# Patient Record
Sex: Male | Born: 1960
Health system: Southern US, Community
[De-identification: ages and names within clinical notes are randomized; demographics above are authoritative.]

## PROBLEM LIST (undated history)

## (undated) DIAGNOSIS — J189 Pneumonia, unspecified organism: Secondary | ICD-10-CM

## (undated) DIAGNOSIS — I10 Essential (primary) hypertension: Secondary | ICD-10-CM

## (undated) DIAGNOSIS — F319 Bipolar disorder, unspecified: Secondary | ICD-10-CM

## (undated) DIAGNOSIS — R569 Unspecified convulsions: Secondary | ICD-10-CM

## (undated) DIAGNOSIS — K259 Gastric ulcer, unspecified as acute or chronic, without hemorrhage or perforation: Secondary | ICD-10-CM

## (undated) DIAGNOSIS — F1010 Alcohol abuse, uncomplicated: Secondary | ICD-10-CM

## (undated) DIAGNOSIS — I639 Cerebral infarction, unspecified: Secondary | ICD-10-CM

## (undated) DIAGNOSIS — I739 Peripheral vascular disease, unspecified: Secondary | ICD-10-CM

## (undated) DIAGNOSIS — K759 Inflammatory liver disease, unspecified: Secondary | ICD-10-CM

## (undated) HISTORY — PX: HERNIA REPAIR: SHX51

## (undated) HISTORY — DX: Bipolar disorder, unspecified: F31.9

## (undated) HISTORY — DX: Essential (primary) hypertension: I10

---

## 1988-10-05 HISTORY — PX: BRAIN TUMOR EXCISION: SHX577

## 1998-05-28 ENCOUNTER — Emergency Department (HOSPITAL_COMMUNITY): Admission: EM | Admit: 1998-05-28 | Discharge: 1998-05-28 | Payer: Self-pay | Admitting: Emergency Medicine

## 1998-12-13 ENCOUNTER — Emergency Department (HOSPITAL_COMMUNITY): Admission: EM | Admit: 1998-12-13 | Discharge: 1998-12-14 | Payer: Self-pay | Admitting: Emergency Medicine

## 1998-12-30 ENCOUNTER — Encounter: Payer: Self-pay | Admitting: Emergency Medicine

## 1998-12-30 ENCOUNTER — Inpatient Hospital Stay (HOSPITAL_COMMUNITY): Admission: EM | Admit: 1998-12-30 | Discharge: 1999-01-06 | Payer: Self-pay | Admitting: Emergency Medicine

## 1999-09-08 ENCOUNTER — Emergency Department (HOSPITAL_COMMUNITY): Admission: EM | Admit: 1999-09-08 | Discharge: 1999-09-08 | Payer: Self-pay | Admitting: Emergency Medicine

## 2000-05-07 ENCOUNTER — Emergency Department (HOSPITAL_COMMUNITY): Admission: EM | Admit: 2000-05-07 | Discharge: 2000-05-07 | Payer: Self-pay | Admitting: Emergency Medicine

## 2000-05-24 ENCOUNTER — Inpatient Hospital Stay (HOSPITAL_COMMUNITY): Admission: EM | Admit: 2000-05-24 | Discharge: 2000-06-03 | Payer: Self-pay

## 2000-05-24 ENCOUNTER — Encounter: Payer: Self-pay | Admitting: Internal Medicine

## 2000-05-25 ENCOUNTER — Encounter: Payer: Self-pay | Admitting: Internal Medicine

## 2000-05-25 ENCOUNTER — Encounter: Payer: Self-pay | Admitting: Pulmonary Disease

## 2000-05-26 ENCOUNTER — Encounter: Payer: Self-pay | Admitting: Pulmonary Disease

## 2000-05-27 ENCOUNTER — Encounter: Payer: Self-pay | Admitting: Pulmonary Disease

## 2000-05-28 ENCOUNTER — Encounter: Payer: Self-pay | Admitting: Pulmonary Disease

## 2000-05-29 ENCOUNTER — Encounter: Payer: Self-pay | Admitting: Pulmonary Disease

## 2000-05-30 ENCOUNTER — Encounter: Payer: Self-pay | Admitting: Pulmonary Disease

## 2000-05-31 ENCOUNTER — Encounter: Payer: Self-pay | Admitting: Pulmonary Disease

## 2000-07-31 ENCOUNTER — Emergency Department (HOSPITAL_COMMUNITY): Admission: EM | Admit: 2000-07-31 | Discharge: 2000-07-31 | Payer: Self-pay | Admitting: Emergency Medicine

## 2000-07-31 ENCOUNTER — Encounter: Payer: Self-pay | Admitting: Emergency Medicine

## 2001-01-07 ENCOUNTER — Encounter: Payer: Self-pay | Admitting: Emergency Medicine

## 2001-01-07 ENCOUNTER — Inpatient Hospital Stay (HOSPITAL_COMMUNITY): Admission: EM | Admit: 2001-01-07 | Discharge: 2001-01-12 | Payer: Self-pay | Admitting: Emergency Medicine

## 2001-01-10 ENCOUNTER — Encounter: Payer: Self-pay | Admitting: Surgery

## 2001-01-19 ENCOUNTER — Emergency Department (HOSPITAL_COMMUNITY): Admission: EM | Admit: 2001-01-19 | Discharge: 2001-01-20 | Payer: Self-pay | Admitting: Emergency Medicine

## 2001-01-20 ENCOUNTER — Encounter: Payer: Self-pay | Admitting: Emergency Medicine

## 2001-04-06 ENCOUNTER — Emergency Department (HOSPITAL_COMMUNITY): Admission: EM | Admit: 2001-04-06 | Discharge: 2001-04-06 | Payer: Self-pay | Admitting: Emergency Medicine

## 2001-04-06 ENCOUNTER — Encounter: Payer: Self-pay | Admitting: Emergency Medicine

## 2002-01-05 ENCOUNTER — Emergency Department (HOSPITAL_COMMUNITY): Admission: EM | Admit: 2002-01-05 | Discharge: 2002-01-05 | Payer: Self-pay

## 2002-02-16 ENCOUNTER — Encounter: Payer: Self-pay | Admitting: Emergency Medicine

## 2002-02-16 ENCOUNTER — Emergency Department (HOSPITAL_COMMUNITY): Admission: EM | Admit: 2002-02-16 | Discharge: 2002-02-16 | Payer: Self-pay | Admitting: Emergency Medicine

## 2002-04-04 ENCOUNTER — Emergency Department (HOSPITAL_COMMUNITY): Admission: EM | Admit: 2002-04-04 | Discharge: 2002-04-04 | Payer: Self-pay | Admitting: Emergency Medicine

## 2002-04-06 ENCOUNTER — Emergency Department (HOSPITAL_COMMUNITY): Admission: EM | Admit: 2002-04-06 | Discharge: 2002-04-06 | Payer: Self-pay

## 2003-08-26 ENCOUNTER — Emergency Department (HOSPITAL_COMMUNITY): Admission: EM | Admit: 2003-08-26 | Discharge: 2003-08-26 | Payer: Self-pay | Admitting: Emergency Medicine

## 2003-09-01 ENCOUNTER — Emergency Department (HOSPITAL_COMMUNITY): Admission: EM | Admit: 2003-09-01 | Discharge: 2003-09-01 | Payer: Self-pay | Admitting: Emergency Medicine

## 2004-10-24 ENCOUNTER — Emergency Department (HOSPITAL_COMMUNITY): Admission: EM | Admit: 2004-10-24 | Discharge: 2004-10-24 | Payer: Self-pay | Admitting: Emergency Medicine

## 2005-07-13 ENCOUNTER — Emergency Department (HOSPITAL_COMMUNITY): Admission: EM | Admit: 2005-07-13 | Discharge: 2005-07-14 | Payer: Self-pay | Admitting: Emergency Medicine

## 2005-10-04 ENCOUNTER — Emergency Department (HOSPITAL_COMMUNITY): Admission: EM | Admit: 2005-10-04 | Discharge: 2005-10-04 | Payer: Self-pay | Admitting: Emergency Medicine

## 2005-11-07 ENCOUNTER — Emergency Department (HOSPITAL_COMMUNITY): Admission: EM | Admit: 2005-11-07 | Discharge: 2005-11-08 | Payer: Self-pay | Admitting: Emergency Medicine

## 2008-03-31 ENCOUNTER — Emergency Department (HOSPITAL_COMMUNITY): Admission: EM | Admit: 2008-03-31 | Discharge: 2008-03-31 | Payer: Self-pay | Admitting: Emergency Medicine

## 2008-06-11 ENCOUNTER — Emergency Department (HOSPITAL_COMMUNITY): Admission: EM | Admit: 2008-06-11 | Discharge: 2008-06-11 | Payer: Self-pay | Admitting: Emergency Medicine

## 2008-12-22 ENCOUNTER — Emergency Department (HOSPITAL_COMMUNITY): Admission: EM | Admit: 2008-12-22 | Discharge: 2008-12-22 | Payer: Self-pay | Admitting: Emergency Medicine

## 2009-01-21 ENCOUNTER — Emergency Department (HOSPITAL_COMMUNITY): Admission: EM | Admit: 2009-01-21 | Discharge: 2009-01-22 | Payer: Self-pay | Admitting: Emergency Medicine

## 2009-02-13 ENCOUNTER — Emergency Department (HOSPITAL_COMMUNITY): Admission: EM | Admit: 2009-02-13 | Discharge: 2009-02-14 | Payer: Self-pay | Admitting: Emergency Medicine

## 2009-04-03 ENCOUNTER — Emergency Department (HOSPITAL_COMMUNITY): Admission: EM | Admit: 2009-04-03 | Discharge: 2009-04-03 | Payer: Self-pay | Admitting: Emergency Medicine

## 2010-04-27 ENCOUNTER — Encounter: Payer: Self-pay | Admitting: Emergency Medicine

## 2010-04-27 ENCOUNTER — Inpatient Hospital Stay (HOSPITAL_COMMUNITY): Admission: EM | Admit: 2010-04-27 | Discharge: 2010-04-28 | Payer: Self-pay

## 2010-06-14 ENCOUNTER — Emergency Department (HOSPITAL_COMMUNITY): Admission: EM | Admit: 2010-06-14 | Discharge: 2010-06-15 | Payer: Self-pay | Admitting: Emergency Medicine

## 2010-06-21 ENCOUNTER — Emergency Department (HOSPITAL_COMMUNITY): Admission: EM | Admit: 2010-06-21 | Discharge: 2010-06-21 | Payer: Self-pay | Admitting: Emergency Medicine

## 2010-07-11 ENCOUNTER — Emergency Department (HOSPITAL_COMMUNITY): Admission: EM | Admit: 2010-07-11 | Discharge: 2010-07-12 | Payer: Self-pay | Admitting: Emergency Medicine

## 2010-09-19 ENCOUNTER — Emergency Department (HOSPITAL_COMMUNITY)
Admission: EM | Admit: 2010-09-19 | Discharge: 2010-09-19 | Disposition: A | Discharge status: Other Acute Inpatient Hospital | Payer: Self-pay | Source: Home / Self Care | Admitting: Emergency Medicine

## 2010-12-15 LAB — DIFFERENTIAL
Lymphocytes Relative: 34 % (ref 12–46)
Lymphs Abs: 3.1 10*3/uL (ref 0.7–4.0)
Monocytes Absolute: 0.6 10*3/uL (ref 0.1–1.0)
Monocytes Relative: 6 % (ref 3–12)
Neutro Abs: 5 10*3/uL (ref 1.7–7.7)

## 2010-12-15 LAB — CBC
HCT: 40.9 % (ref 39.0–52.0)
Hemoglobin: 14 g/dL (ref 13.0–17.0)
MCHC: 34.2 g/dL (ref 30.0–36.0)
WBC: 9 10*3/uL (ref 4.0–10.5)

## 2010-12-15 LAB — BASIC METABOLIC PANEL
GFR calc non Af Amer: >60 mL/min (ref >60)
Glucose, Bld: 140 mg/dL — ABNORMAL HIGH (ref 70–99)
Potassium: 3.7 mEq/L (ref 3.5–5.1)
Sodium: 143 mEq/L (ref 135–145)

## 2010-12-17 LAB — DIFFERENTIAL
Basophils Absolute: 0 10*3/uL (ref 0.0–0.1)
Basophils Relative: 1 % (ref 0–1)
Eosinophils Absolute: 0.3 10*3/uL (ref 0.0–0.7)
Monocytes Absolute: 0.6 10*3/uL (ref 0.1–1.0)
Neutro Abs: 4.5 10*3/uL (ref 1.7–7.7)
Neutrophils Relative %: 60 % (ref 43–77)

## 2010-12-17 LAB — URINALYSIS, ROUTINE W REFLEX MICROSCOPIC
Bilirubin Urine: NEGATIVE
Glucose, UA: NEGATIVE mg/dL
Hgb urine dipstick: NEGATIVE
Specific Gravity, Urine: 1.007 (ref 1.005–1.030)
pH: 6 (ref 5.0–8.0)

## 2010-12-17 LAB — CBC
HCT: 41.4 % (ref 39.0–52.0)
MCH: 31.2 pg (ref 26.0–34.0)
MCHC: 34.5 g/dL (ref 30.0–36.0)
RDW: 14.3 % (ref 11.5–15.5)

## 2010-12-17 LAB — BASIC METABOLIC PANEL
BUN: 6 mg/dL (ref 6–23)
Calcium: 8.8 mg/dL (ref 8.4–10.5)
GFR calc non Af Amer: >60 mL/min (ref >60)
Glucose, Bld: 100 mg/dL — ABNORMAL HIGH (ref 70–99)

## 2010-12-17 LAB — RAPID URINE DRUG SCREEN, HOSP PERFORMED
Amphetamines: NOT DETECTED
Cocaine: NOT DETECTED
Opiates: NOT DETECTED
Tetrahydrocannabinol: NOT DETECTED

## 2010-12-18 LAB — CBC
HCT: 42.3 % (ref 39.0–52.0)
Hemoglobin: 14.2 g/dL (ref 13.0–17.0)
MCH: 30.5 pg (ref 26.0–34.0)
MCHC: 33.6 g/dL (ref 30.0–36.0)
MCV: 90.8 fL (ref 78.0–100.0)
Platelets: 158 K/uL (ref 150–400)
RBC: 4.66 MIL/uL (ref 4.22–5.81)
RDW: 13.8 % (ref 11.5–15.5)
WBC: 7.5 K/uL (ref 4.0–10.5)

## 2010-12-18 LAB — URINALYSIS, ROUTINE W REFLEX MICROSCOPIC
Bilirubin Urine: NEGATIVE
Glucose, UA: NEGATIVE mg/dL
Hgb urine dipstick: NEGATIVE
Ketones, ur: NEGATIVE mg/dL
Nitrite: NEGATIVE
Protein, ur: NEGATIVE mg/dL
Specific Gravity, Urine: 1.018 (ref 1.005–1.030)
Urobilinogen, UA: 0.2 mg/dL (ref 0.0–1.0)
pH: 5 (ref 5.0–8.0)

## 2010-12-18 LAB — RAPID URINE DRUG SCREEN, HOSP PERFORMED
Amphetamines: NOT DETECTED
Barbiturates: NOT DETECTED
Benzodiazepines: NOT DETECTED
Cocaine: NOT DETECTED
Opiates: NOT DETECTED
Tetrahydrocannabinol: POSITIVE — AB

## 2010-12-18 LAB — ETHANOL: Alcohol, Ethyl (B): 248 mg/dL — ABNORMAL HIGH (ref 0–10)

## 2010-12-18 LAB — COMPREHENSIVE METABOLIC PANEL
ALT: 20 U/L (ref 0–53)
AST: 25 U/L (ref 0–37)
Albumin: 3.9 g/dL (ref 3.5–5.2)
CO2: 21 mEq/L (ref 19–32)
Calcium: 8.5 mg/dL (ref 8.4–10.5)
Creatinine, Ser: 0.81 mg/dL (ref 0.4–1.5)
GFR calc Af Amer: >60 mL/min (ref >60)
GFR calc non Af Amer: >60 mL/min (ref >60)
Sodium: 139 mEq/L (ref 135–145)
Total Protein: 7.3 g/dL (ref 6.0–8.3)

## 2010-12-18 LAB — APTT: aPTT: 29 seconds (ref 24–37)

## 2010-12-18 LAB — DIFFERENTIAL
Basophils Absolute: 0 K/uL (ref 0.0–0.1)
Basophils Relative: 1 % (ref 0–1)
Eosinophils Absolute: 0.1 K/uL (ref 0.0–0.7)
Eosinophils Relative: 2 % (ref 0–5)
Lymphocytes Relative: 25 % (ref 12–46)
Lymphs Abs: 1.9 K/uL (ref 0.7–4.0)
Monocytes Absolute: 0.5 K/uL (ref 0.1–1.0)
Monocytes Relative: 7 % (ref 3–12)
Neutro Abs: 5 K/uL (ref 1.7–7.7)
Neutrophils Relative %: 67 % (ref 43–77)

## 2010-12-18 LAB — LIPASE, BLOOD: Lipase: 24 U/L (ref 11–59)

## 2010-12-20 LAB — PROTIME-INR
INR: 0.98 (ref 0.00–1.49)
Prothrombin Time: 12.9 seconds (ref 11.6–15.2)

## 2010-12-20 LAB — CBC
HCT: 40.6 % (ref 39.0–52.0)
MCH: 31.2 pg (ref 26.0–34.0)
MCV: 91.9 fL (ref 78.0–100.0)
RBC: 4.42 MIL/uL (ref 4.22–5.81)
WBC: 6.6 10*3/uL (ref 4.0–10.5)

## 2010-12-20 LAB — DIFFERENTIAL
Basophils Absolute: 0.1 10*3/uL (ref 0.0–0.1)
Basophils Relative: 1 % (ref 0–1)
Neutro Abs: 3.7 10*3/uL (ref 1.7–7.7)
Neutrophils Relative %: 55 % (ref 43–77)

## 2010-12-20 LAB — COMPREHENSIVE METABOLIC PANEL
Alkaline Phosphatase: 81 U/L (ref 39–117)
BUN: 9 mg/dL (ref 6–23)
Chloride: 109 mEq/L (ref 96–112)
Creatinine, Ser: 0.85 mg/dL (ref 0.4–1.5)
GFR calc non Af Amer: >60 mL/min (ref >60)
Glucose, Bld: 92 mg/dL (ref 70–99)
Potassium: 3.5 mEq/L (ref 3.5–5.1)
Total Bilirubin: 0.2 mg/dL — ABNORMAL LOW (ref 0.3–1.2)

## 2010-12-20 LAB — RAPID URINE DRUG SCREEN, HOSP PERFORMED
Barbiturates: NOT DETECTED
Benzodiazepines: NOT DETECTED
Opiates: NOT DETECTED

## 2010-12-20 LAB — PHENYTOIN LEVEL, TOTAL: Phenytoin Lvl: 0.7 ug/mL — ABNORMAL LOW (ref 10.0–20.0)

## 2011-01-12 LAB — POCT I-STAT, CHEM 8
BUN: 7 mg/dL (ref 6–23)
Calcium, Ion: 1.05 mmol/L — ABNORMAL LOW (ref 1.12–1.32)
Chloride: 111 mEq/L (ref 96–112)
Creatinine, Ser: 1 mg/dL (ref 0.4–1.5)
Glucose, Bld: 98 mg/dL (ref 70–99)
HCT: 48 % (ref 39.0–52.0)
Hemoglobin: 16.3 g/dL (ref 13.0–17.0)
Potassium: 4.1 mEq/L (ref 3.5–5.1)
Sodium: 142 meq/L (ref 135–145)
TCO2: 19 mmol/L (ref 0–100)

## 2011-01-12 LAB — RAPID URINE DRUG SCREEN, HOSP PERFORMED
Amphetamines: NOT DETECTED
Barbiturates: NOT DETECTED
Benzodiazepines: NOT DETECTED
Cocaine: NOT DETECTED
Opiates: NOT DETECTED
Tetrahydrocannabinol: NOT DETECTED

## 2011-01-12 LAB — CBC
HCT: 43.5 % (ref 39.0–52.0)
Hemoglobin: 14.6 g/dL (ref 13.0–17.0)
MCHC: 33.6 g/dL (ref 30.0–36.0)
MCV: 92.9 fL (ref 78.0–100.0)
Platelets: 187 K/uL (ref 150–400)
RBC: 4.68 MIL/uL (ref 4.22–5.81)
RDW: 14 % (ref 11.5–15.5)
WBC: 8.8 K/uL (ref 4.0–10.5)

## 2011-01-12 LAB — DIFFERENTIAL
Basophils Absolute: 0 10*3/uL (ref 0.0–0.1)
Basophils Relative: 1 % (ref 0–1)
Eosinophils Absolute: 0.2 K/uL (ref 0.0–0.7)
Eosinophils Relative: 3 % (ref 0–5)
Lymphocytes Relative: 27 % (ref 12–46)
Lymphs Abs: 2.4 K/uL (ref 0.7–4.0)
Monocytes Absolute: 0.8 K/uL (ref 0.1–1.0)
Monocytes Relative: 9 % (ref 3–12)
Neutro Abs: 5.4 10*3/uL (ref 1.7–7.7)
Neutrophils Relative %: 61 % (ref 43–77)

## 2011-01-12 LAB — URINALYSIS, ROUTINE W REFLEX MICROSCOPIC
Bilirubin Urine: NEGATIVE
Glucose, UA: NEGATIVE mg/dL
Hgb urine dipstick: NEGATIVE
Ketones, ur: NEGATIVE mg/dL
Nitrite: NEGATIVE
Protein, ur: NEGATIVE mg/dL
Specific Gravity, Urine: 1.009 (ref 1.005–1.030)
Urobilinogen, UA: 0.2 mg/dL (ref 0.0–1.0)
pH: 5 (ref 5.0–8.0)

## 2011-01-12 LAB — ETHANOL: Alcohol, Ethyl (B): 287 mg/dL — ABNORMAL HIGH (ref 0–10)

## 2011-01-13 LAB — BASIC METABOLIC PANEL
BUN: 6 mg/dL (ref 6–23)
CO2: 23 mEq/L (ref 19–32)
Chloride: 104 mEq/L (ref 96–112)
Creatinine, Ser: 0.77 mg/dL (ref 0.4–1.5)

## 2011-01-13 LAB — CBC
MCHC: 34.3 g/dL (ref 30.0–36.0)
MCV: 91.2 fL (ref 78.0–100.0)
Platelets: 129 10*3/uL — ABNORMAL LOW (ref 150–400)

## 2011-01-13 LAB — ETHANOL: Alcohol, Ethyl (B): 196 mg/dL — ABNORMAL HIGH (ref 0–10)

## 2011-01-13 LAB — DIFFERENTIAL
Basophils Relative: 1 % (ref 0–1)
Eosinophils Absolute: 0.1 10*3/uL (ref 0.0–0.7)
Eosinophils Relative: 3 % (ref 0–5)
Monocytes Relative: 9 % (ref 3–12)
Neutrophils Relative %: 47 % (ref 43–77)

## 2011-01-13 LAB — RAPID URINE DRUG SCREEN, HOSP PERFORMED
Barbiturates: NOT DETECTED
Benzodiazepines: NOT DETECTED
Cocaine: NOT DETECTED
Opiates: NOT DETECTED

## 2011-01-15 LAB — ETHANOL: Alcohol, Ethyl (B): 379 mg/dL — ABNORMAL HIGH (ref 0–10)

## 2011-02-20 NOTE — Discharge Summary (Signed)
[facility]  Patient:    [name], [name]                    MRN: [id number] Adm. Date:  [id number] Disc. Date: [id number] Attending:  Merwyn Katos CC:         [facility]   Discharge Summary  DATE OF BIRTH:  October 11, 1960.  ADMITTING DIAGNOSES: 1. Community-acquired pneumonia. 2. Alcohol abuse. 3. Smoker.  DISCHARGE DIAGNOSES: 1. Respiratory failure secondary to community-acquired pneumonia, resolved. 2. Alcohol abuse. 3. Tobacco abuse. 4. Toxic/metabolic encephalopathy.  HISTORY OF PRESENT ILLNESS:  Please refer to the admission history and physical for this patients initial presentation.  Briefly, he is a 50 year old gentleman who presented with 36 hours of nausea, vomiting, fevers, chest pain, back pain, cough, and shortness of breath.  He was admitted by Dr. Burton Apley for treatment of pneumonia.  Chest x-ray on admission demonstrated bilateral lower lobe pneumonia with extensive consolidation in the right lower lobe.  HOSPITAL COURSE:  He was initially treated with ceftriaxone, oxygen, and nebulized bronchodilators.  He was seen in consultation by Dr. Burnice Logan of infectious diseases on May 25, 2000.  It was Dr. Elder Negus feeling that this most likely represented pneumococcal pneumonia with severe sepsis syndrome.  Unfortunately, no microbial data was obtained to confirm this impression definitively.  Dr. Roxan Hockey suggested that he be transferred to the intensive care unit due to severe respiratory distress.  He requested a critical care medicine consultation.  I saw him in consultation on May 25, 2000, and noted severe respiratory distress requiring intubation.  The patient did develop progressive worsening of bilateral infiltrates on the day following intubation consistent with ARDS.  His antibiotics were changed to Rocephin, Vancomycin, and Tequin for all possible offending organisms.  He was maintained on  ventilatory support until he extubated himself on May 30, 2000.  He was noted to have significant agitation and confusion postextubation, and this persisted until the 28th of August.  However, his mental status did improve and at the time of discharge, he was comfortable breathing room air with no complaints.  His encephalopathy had resolved completely.  DISCHARGE MEDICATIONS:  None.  FOLLOW-UP:  I recommended that he contact [facility] to arrange follow-up in two weeks.DD:  06/03/00 TD:  06/04/00 Job: [id number]

## 2011-02-20 NOTE — Discharge Summary (Signed)
[facility] [place] [facility]  Patient:    [name], [name]                    MRN: [id number] Adm. Date:  [id number] Disc. Date: 01/04/01 Attending:  Alwyn Pea D.                           Discharge Summary  ADMITTING DIAGNOSIS:  Cellulitis.  SECONDARY DIAGNOSIS:  Bronchitis, also positive PPD.  PRIMARY PROCEDURES:  Blood draws and chest x-ray.  SECONDARY PROCEDURES:  IV antibiotics.  HOSPITAL COURSE:  This is a 50 year old African-American male admitted with left leg/groin cellulitis, who responded well to IV antibiotics.  The patient did have a positive PPD but negative chest x-ray, and therefore ______. The patient continued to defervesce and did well and was discharged to home on hospital day #5 on discharge medications that include Augmentin 875 p.o. b.i.d. for 14 days, ______ Cipro 500 mg b.i.d. for 14 days, and the patient was instructed to follow up with ______. DD:  01/12/01 TD:  01/12/01 Job: [id number]

## 2011-02-20 NOTE — H&P (Signed)
[place]  Patient:    [name], [name]                    MRN: [id number] Adm. 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  The patient felt well until approximately 36 hours prior to admission, when he began having a cough, felt feverish, had nausea and vomiting, chest pain, and back pain.  His history is very difficult and often vague.  The patient admits to ETOH abuse in variable amounts, with a history of distant pancreatitis.  He admits, however, to be less likely to drink most recently.  He admits to cigarette abuse.  In the emergency room a chest x-ray showed bilateral lower lobe pneumonia, worse on the right, a PO2 of 48 on room air.  The admission is felt necessary because of bilateral pneumonia.  The patient was actively spitting up blood in the emergency room, and gives a history of also throwing up blood.  CURRENT MEDICATIONS:  None.  PHYSICAL EXAMINATION:  VITAL SIGNS:  Blood pressure 150/60, pulse 100, respirations 20 and slightly labored, temperature 97.8 degrees.  SKIN:  Warm and dry.  HEENT:  Normal.  The patient has poor dentition with one upper molar very painful and cavitation.Marland Kitchen  NECK:  Supple, no adenopathy or thyromegaly.  LUNGS:  Bilateral mild bronchospasm and rhonchi.  HEART:  Tachycardia, no murmurs, rubs, or gallops.  ABDOMEN:  Soft, heme-negative brown stool.  EXTREMITIES:  Normal.  BACK:  Tenderness to palpation in the paraspinal areas of T4 through T8.  IMPRESSION: 1. Bilateral pneumonia, etiology unknown. 2. Back pain, possibly musculotendinous in origin. 3. ETOH abuse. 4. Cigarette abuse.  PLAN:  The patient will be admitted for IV fluids and antibiotics.  Prior to this sputum  specimen for culture and Grams stain will be obtained.  Pulmonary toilette.  Supportive care. DD:  05/24/00 TD:  05/25/00 Job: [id number]

## 2011-02-20 NOTE — H&P (Signed)
[facility]  Patient:    [name]                      MRN: [id number] Adm. Date:  01/07/01 Disc. Date: 01/12/01 Attending:  Alwyn Pea, M.D.                         History and Physical  HISTORY OF PRESENT ILLNESS:  The patient is a 50 year old African-American who presented to the emergency department complaining of left upper thigh and groin pain for the past 24 hours.  The patient also reports chills and subjective temperature.  She denied dysuria, increased urgency, or increased frequency.  No known insect bites.  No new sex partners.  He does have a cough which is nonproductive.  The patient does have a history of being diagnosed with several pneumonias and chronic bronchitis in the past.  The patient receives his current medical care at [facility].  PAST MEDICAL HISTORY:  Chronic bronchitis.  MEDICATIONS:  "Green pill for breathing."  ALLERGIES:  No known drug allergies.  PAST SURGICAL HISTORY:  None.  SOCIAL HISTORY:  He smokes 1/2 pack per day for the past 20 years.  He drinks alcohol daily.  PHYSICAL EXAMINATION:  GENERAL:  Moderate distress secondary to groin pain.  VITAL SIGNS:  Temperature 98.7, heart rate 107, respiratory rate 22, blood pressure 143/86, saturation 99% on room air.  HEENT:  Mucous membranes moist.  No lesions.  Pupils are equal, round and reactive to light.  Extraocular movements intact.  Oropharynx shows no exudate or lesions.  NECK:  Supple without adenopathy.  LUNGS:  Scattered rhonchi.  HEART:  Regular rate and rhythm.  No murmurs.  ABDOMEN:  Soft and nontender.  Nondistended.  Normoactive bowel sounds.  EXTREMITIES:  Negative clubbing, cyanosis, or edema.  Small pretibial abrasion to the left leg without erythema, warmth or induration.  The left groin had a large, fluctuant, tender, erythematous mass.  No puncture wounds noted. Positive inguinal adenopathy.  Left thigh tender to  palpation.  NEUROLOGIC:  Nonfocal.  LABORATORY DATA:  UA reveals greater than 80 ketones and specific gravity 1.077.  White count 17.8, hemoglobin 13.1, hematocrit 38.5, platelets 142. SGOT 80, SGPT 99, sodium 134.  Chest x-ray negative.  ASSESSMENT AND PLAN:  A 50 year old with groin cellulitis of questionable etiology.  Treatment with IV Ancef, Darvocet for pain and will start on albuterol and Atrovent breathing treatments for his bronchitis. DD:  01/12/01 TD:  01/12/01 Job: [id number]

## 2011-07-02 LAB — CBC
HCT: 40.2
Hemoglobin: 13.6
MCV: 89.5
RBC: 4.49
WBC: 7.6

## 2011-07-02 LAB — DIFFERENTIAL
Eosinophils Absolute: 0.2
Eosinophils Relative: 2
Lymphs Abs: 2.1
Monocytes Absolute: 0.6
Monocytes Relative: 7

## 2011-07-02 LAB — CK TOTAL AND CKMB (NOT AT ARMC)
CK, MB: 4.7 — ABNORMAL HIGH
Relative Index: 0.6
Total CK: 829 — ABNORMAL HIGH

## 2011-07-02 LAB — POCT I-STAT, CHEM 8
Creatinine, Ser: 0.9
Glucose, Bld: 97
Hemoglobin: 14.6
TCO2: 19

## 2011-07-02 LAB — RAPID URINE DRUG SCREEN, HOSP PERFORMED
Barbiturates: NOT DETECTED
Benzodiazepines: NOT DETECTED

## 2011-07-08 LAB — RAPID URINE DRUG SCREEN, HOSP PERFORMED
Amphetamines: NOT DETECTED
Barbiturates: NOT DETECTED

## 2011-07-08 LAB — COMPREHENSIVE METABOLIC PANEL
ALT: 21
AST: 32
Albumin: 3.9
Alkaline Phosphatase: 86
CO2: 24
Chloride: 112
Creatinine, Ser: 1
GFR calc Af Amer: >60
GFR calc non Af Amer: >60
Potassium: 3.9
Total Bilirubin: 0.5

## 2011-07-08 LAB — CBC
MCV: 91.7
RBC: 4.71
WBC: 8.8

## 2011-07-08 LAB — URINALYSIS, ROUTINE W REFLEX MICROSCOPIC
Hgb urine dipstick: NEGATIVE
Ketones, ur: NEGATIVE
Protein, ur: NEGATIVE
Urobilinogen, UA: 0.2

## 2011-07-08 LAB — DIFFERENTIAL
Basophils Absolute: 0
Basophils Relative: 0
Eosinophils Absolute: 0.2
Eosinophils Relative: 2
Monocytes Absolute: 0.6

## 2011-07-08 LAB — ETHANOL: Alcohol, Ethyl (B): 370 — ABNORMAL HIGH

## 2012-12-03 ENCOUNTER — Emergency Department (HOSPITAL_COMMUNITY)
Admission: EM | Admit: 2012-12-03 | Discharge: 2012-12-03 | Disposition: A | Discharge status: Home / Self Care | Payer: Self-pay | Attending: Emergency Medicine | Admitting: Emergency Medicine

## 2012-12-03 DIAGNOSIS — F10929 Alcohol use, unspecified with intoxication, unspecified: Secondary | ICD-10-CM

## 2012-12-03 DIAGNOSIS — F1010 Alcohol abuse, uncomplicated: Secondary | ICD-10-CM

## 2012-12-03 LAB — BASIC METABOLIC PANEL
BUN: 8 mg/dL (ref 6–23)
Chloride: 105 mEq/L (ref 96–112)
GFR calc Af Amer: >90 mL/min (ref >90)
GFR calc non Af Amer: >90 mL/min (ref >90)
Potassium: 3.9 mEq/L (ref 3.5–5.1)

## 2012-12-03 LAB — RAPID URINE DRUG SCREEN, HOSP PERFORMED
Barbiturates: NOT DETECTED
Benzodiazepines: NOT DETECTED
Cocaine: NOT DETECTED

## 2012-12-03 LAB — CBC
MCHC: 33.7 g/dL (ref 30.0–36.0)
Platelets: 171 10*3/uL (ref 150–400)
RDW: 14 % (ref 11.5–15.5)
WBC: 8.8 10*3/uL (ref 4.0–10.5)

## 2012-12-03 LAB — ETHANOL: Alcohol, Ethyl (B): 311 mg/dL — ABNORMAL HIGH (ref 0–11)

## 2012-12-03 MED ORDER — SODIUM CHLORIDE 0.9 % IV BOLUS (SEPSIS)
1000.0000 mL | Freq: Once | INTRAVENOUS | Status: AC
  Administered 2012-12-03: 1000 mL via INTRAVENOUS
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
## 2012-12-03 NOTE — ED Notes (Signed)
Bed:WA21<BR> Expected date:<BR> Expected time:<BR> Means of arrival:<BR> Comments:<BR> EMS

## 2012-12-03 NOTE — ED Notes (Signed)
pts belongings underneath nurses station. 4 pt belonging bags

## 2012-12-03 NOTE — ED Notes (Signed)
Pt was able to ambulate with no assistance. No dizziness, no nausea.

## 2012-12-03 NOTE — ED Provider Notes (Signed)
History     CSN: [id number]  Arrival date & time 12/03/12  1647   First MD Initiated Contact with Patient 12/03/12 1711      Chief Complaint  Patient presents with  . Seizures    (Consider location/radiation/quality/duration/timing/severity/associated sxs/prior treatment) HPI A LEVEL 5 CAVEAT PERTAINS DUE TO ALTERED MENTAL STATUS/INTOXICATION Pt presents with EMS due to intoxication.  There was some report of possible seizure, but pt does endorse drinking alcohol today.  He has no signs of head trauma.  No vomiting.     No past medical history on file.  No past surgical history on file.  No family history on file.  History  Substance Use Topics  . Smoking status: Not on file  . Smokeless tobacco: Not on file  . Alcohol Use: Not on file      Review of Systems UNABLE TO OBTAIN ROS DUE TO LEVEL 5 CAVEAT  Allergies  Review of patient's allergies indicates not on file.  Home Medications  No current outpatient prescriptions on file.  BP 139/88  Pulse 102  Temp(Src) 97.9 F (36.6 C) (Oral)  Resp 22  SpO2 92% Vitals reviewed Physical Exam Physical Examination: General appearance - alert, intoxicated appearing, and in no distress Mental status - alert, oriented to person, place, and time  Head- NCAT Neck- no midline tenderness to palpation, FROM without pain Eyes - pupils equal and reactive, extraocular eye movements intact Mouth - mucous membranes moist, pharynx normal without lesions Chest - clear to auscultation, no wheezes, rales or rhonchi, symmetric air entry Heart - normal rate, regular rhythm, normal S1, S2, no murmurs, rubs, clicks or gallops Abdomen - soft, nontender, nondistended, no masses or organomegaly Neurological - alert, oriented, cranial nerves 2-12 tested and intact, strength 5/5 in extremities x 4, sensation intact, pt following commands but intoxicated Extremities - peripheral pulses normal, no pedal edema, no clubbing or cyanosis Skin -  normal coloration and turgor, no rashes  ED Course  Procedures (including critical care time)  8:13 PM pt has been medically cleared, he remains intoxicated- 2 numbers have been called by nursing in an attempt to find someone to pick him up.  Pt is being discharged, police have been called and they will take patient to have him sober up in drunk tank.    Labs Reviewed  BASIC METABOLIC PANEL - Abnormal; Notable for the following:    Glucose, Bld 116 (*)    All other components within normal limits  ETHANOL - Abnormal; Notable for the following:    Alcohol, Ethyl (B) 311 (*)    All other components within normal limits  CBC  URINE RAPID DRUG SCREEN (HOSP PERFORMED)   No results found.   1. Alcohol intoxication   2. Alcohol abuse       MDM  Pt presents with acute alcohol intoxication, no signs of head injury or other areas of trauma on exam.  He was reported to have had seizure- he has mention of seizure disorder on prior chart- is not on medications at this time.  He is medically cleared.  Remains intoxicated, but improving.  No one available to pick him up.  Charge nurse has called [employer] and they are coming to take patient.         Ethelda Chick, MD 12/03/12 2035

## 2013-05-13 ENCOUNTER — Encounter (HOSPITAL_COMMUNITY): Payer: Self-pay | Admitting: Nurse Practitioner

## 2013-05-13 ENCOUNTER — Emergency Department (HOSPITAL_COMMUNITY)
Admission: EM | Admit: 2013-05-13 | Discharge: 2013-05-14 | Disposition: A | Discharge status: Home / Self Care | Payer: Self-pay | Attending: Emergency Medicine | Admitting: Emergency Medicine

## 2013-05-13 DIAGNOSIS — W268XXA Contact with other sharp object(s), not elsewhere classified, initial encounter: Secondary | ICD-10-CM | POA: Insufficient documentation

## 2013-05-13 DIAGNOSIS — S6992XA Unspecified injury of left wrist, hand and finger(s), initial encounter: Secondary | ICD-10-CM

## 2013-05-13 DIAGNOSIS — Z8669 Personal history of other diseases of the nervous system and sense organs: Secondary | ICD-10-CM | POA: Insufficient documentation

## 2013-05-13 DIAGNOSIS — Y93G9 Activity, other involving cooking and grilling: Secondary | ICD-10-CM | POA: Insufficient documentation

## 2013-05-13 DIAGNOSIS — T6391XA Toxic effect of contact with unspecified venomous animal, accidental (unintentional), initial encounter: Secondary | ICD-10-CM | POA: Insufficient documentation

## 2013-05-13 DIAGNOSIS — Z23 Encounter for immunization: Secondary | ICD-10-CM | POA: Insufficient documentation

## 2013-05-13 DIAGNOSIS — F17200 Nicotine dependence, unspecified, uncomplicated: Secondary | ICD-10-CM | POA: Insufficient documentation

## 2013-05-13 DIAGNOSIS — S61209A Unspecified open wound of unspecified finger without damage to nail, initial encounter: Secondary | ICD-10-CM | POA: Insufficient documentation

## 2013-05-13 DIAGNOSIS — IMO0001 Reserved for inherently not codable concepts without codable children: Secondary | ICD-10-CM | POA: Insufficient documentation

## 2013-05-13 DIAGNOSIS — Y929 Unspecified place or not applicable: Secondary | ICD-10-CM | POA: Insufficient documentation

## 2013-05-13 HISTORY — DX: Unspecified convulsions: R56.9

## 2013-05-13 MED ORDER — ACETAMINOPHEN 325 MG PO TABS
650.0000 mg | ORAL_TABLET | Freq: Once | ORAL | Status: AC
  Administered 2013-05-14: 650 mg via ORAL
  Filled 2013-05-13: qty 2

## 2013-05-13 MED ORDER — TETANUS-DIPHTH-ACELL PERTUSSIS 5-2.5-18.5 LF-MCG/0.5 IM SUSP
0.5000 mL | Freq: Once | INTRAMUSCULAR | Status: AC
  Administered 2013-05-14: 0.5 mL via INTRAMUSCULAR
  Filled 2013-05-13: qty 0.5

## 2013-05-13 NOTE — ED Notes (Addendum)
Per EMS: Pt cut right index finger while cooking dinner (bandaide on and no active bleeding).  C/O bottom lip swelling from some sort of bite.  Pt is intoxicated as well.   VS: HR 96, 130/94, 97% RA, Resp 18.

## 2013-05-13 NOTE — ED Provider Notes (Signed)
  CSN: [id number]     Arrival date & time 05/13/13  2139 History     First MD Initiated Contact with Patient 05/13/13 2216     Chief Complaint  Patient presents with  . Extremity Laceration  . Insect Bite    lower lip on left    (Consider location/radiation/quality/duration/timing/severity/associated sxs/prior Treatment) Patient is a 52 y.o. male presenting with facial injury. The history is provided by the patient.  Facial Injury Injury mechanism: He reports being stung by a large flying insect causing swelling to the left lower lip and chin.. Associated symptoms: no neck pain   Associated symptoms comment:  No dental pain. He also complains of a finger injury to left index finger but reports he cannot remember how it happened.    Past Medical History  Diagnosis Date  . Seizures    Past Surgical History  Procedure Laterality Date  . Brain tumor excision     Family History  Problem Relation Age of Onset  . Alcoholism Mother   . Alcoholism Father    History  Substance Use Topics  . Smoking status: Current Some Day Smoker  . Smokeless tobacco: Not on file  . Alcohol Use: 0.6 oz/week    1 Cans of beer per week    Review of Systems  Constitutional: Negative for fever.  HENT: Positive for facial swelling. Negative for neck pain and dental problem.   Skin:       See HPI.    Allergies  Review of patient's allergies indicates no known allergies.  Home Medications  No current outpatient prescriptions on file. BP 128/98  Pulse 79  Temp(Src) 98.5 F (36.9 C)  Resp 20  SpO2 96% Physical Exam  Constitutional: He is oriented to person, place, and time. He appears well-developed and well-nourished.  Acutely intoxicated.  HENT:  Head: Normocephalic.  Facial swelling affecting left lower lip and perioral mouth. No intraoral swelling. Stable dentition with widespread gingival plaque. No dental tenderness. No ulcerations, lesions or discrete or palpable abscesses. No  discoloration to lip or face.   Pulmonary/Chest: Effort normal.  Neurological: He is alert and oriented to person, place, and time.  Skin:  Left index finger bleeding around lateral nail without subungual hematoma, cutaneous laceration, significant tenderness or bony deformity.  Psychiatric: He has a normal mood and affect.    ED Course   Procedures (including critical care time)  Labs Reviewed - No data to display No results found. No diagnosis found. 1. Insect sting, face 2. Left index finger injury. MDM  The patient is stable. He is intoxicated but alert and answers questions appropriately. Finger cleaned and bandaged. Will treat facial swelling as insect sting/bite (no evidence of dental infection, no cutaneous abscess, patient saw flying insect) - cool compresses, Tylenol. Will avoid ibuprofen with heavy alcohol use. Stable for discharge.   Arnoldo Hooker, PA-C 05/13/13 2348

## 2013-05-18 NOTE — ED Provider Notes (Signed)
Medical screening examination/treatment/procedure(s) were performed by non-physician practitioner and as supervising physician I was immediately available for consultation/collaboration.  Lylee Corrow T Giang Hemme, MD 05/18/13 1218 

## 2014-03-04 ENCOUNTER — Encounter (HOSPITAL_COMMUNITY): Payer: Self-pay | Admitting: Emergency Medicine

## 2014-03-04 ENCOUNTER — Emergency Department (HOSPITAL_COMMUNITY)
Admission: EM | Admit: 2014-03-04 | Discharge: 2014-03-04 | Disposition: A | Discharge status: Home / Self Care | Payer: Self-pay | Attending: Emergency Medicine | Admitting: Emergency Medicine

## 2014-03-04 DIAGNOSIS — F17200 Nicotine dependence, unspecified, uncomplicated: Secondary | ICD-10-CM | POA: Insufficient documentation

## 2014-03-04 DIAGNOSIS — F10929 Alcohol use, unspecified with intoxication, unspecified: Secondary | ICD-10-CM

## 2014-03-04 DIAGNOSIS — S50312A Abrasion of left elbow, initial encounter: Secondary | ICD-10-CM

## 2014-03-04 DIAGNOSIS — F1010 Alcohol abuse, uncomplicated: Secondary | ICD-10-CM | POA: Insufficient documentation

## 2014-03-04 DIAGNOSIS — IMO0002 Reserved for concepts with insufficient information to code with codable children: Secondary | ICD-10-CM | POA: Insufficient documentation

## 2014-03-04 DIAGNOSIS — Z8669 Personal history of other diseases of the nervous system and sense organs: Secondary | ICD-10-CM | POA: Insufficient documentation
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
## 2014-03-04 NOTE — Discharge Instructions (Signed)
Abrasion °An abrasion is a cut or scrape of the skin. Abrasions do not extend through all layers of the skin and most heal within 10 days. It is important to care for your abrasion properly to prevent infection. °CAUSES  °Most abrasions are caused by falling on, or gliding across, the ground or other surface. When your skin rubs on something, the outer and inner layer of skin rubs off, causing an abrasion. °DIAGNOSIS  °Your caregiver will be able to diagnose an abrasion during a physical exam.  °TREATMENT  °Your treatment depends on how large and deep the abrasion is. Generally, your abrasion will be cleaned with water and a mild soap to remove any dirt or debris. An antibiotic ointment may be put over the abrasion to prevent an infection. A bandage (dressing) may be wrapped around the abrasion to keep it from getting dirty.  °You may need a tetanus shot if: °· You cannot remember when you had your last tetanus shot. °· You have never had a tetanus shot. °· The injury broke your skin. °If you get a tetanus shot, your arm may swell, get red, and feel warm to the touch. This is common and not a problem. If you need a tetanus shot and you choose not to have one, there is a rare chance of getting tetanus. Sickness from tetanus can be serious.  °HOME CARE INSTRUCTIONS  °· If a dressing was applied, change it at least once a day or as directed by your caregiver. If the bandage sticks, soak it off with warm water.   °· Wash the area with water and a mild soap to remove all the ointment 2 times a day. Rinse off the soap and pat the area dry with a clean towel.   °· Reapply any ointment as directed by your caregiver. This will help prevent infection and keep the bandage from sticking. Use gauze over the wound and under the dressing to help keep the bandage from sticking.   °· Change your dressing right away if it becomes wet or dirty.   °· Only take over-the-counter or prescription medicines for pain, discomfort, or fever as  directed by your caregiver.   °· Follow up with your caregiver within 24 48 hours for a wound check, or as directed. If you were not given a wound-check appointment, look closely at your abrasion for redness, swelling, or pus. These are signs of infection. °SEEK IMMEDIATE MEDICAL CARE IF:  °· You have increasing pain in the wound.   °· You have redness, swelling, or tenderness around the wound.   °· You have pus coming from the wound.   °· You have a fever or persistent symptoms for more than 2 3 days. °· You have a fever and your symptoms suddenly get worse. °· You have a bad smell coming from the wound or dressing.   °MAKE SURE YOU:  °· Understand these instructions. °· Will watch your condition. °· Will get help right away if you are not doing well or get worse. °Document Released: 07/01/2005 Document Revised: 09/07/2012 Document Reviewed: 08/25/2011 °ExitCare® Patient Information ©2014 [employer]. ° °Alcohol Problems °Most adults who drink alcohol drink in moderation (not a lot) are at low risk for developing problems related to their drinking. However, all drinkers, including low-risk drinkers, should know about the health risks connected with drinking alcohol. °RECOMMENDATIONS FOR LOW-RISK DRINKING  °Drink in moderation. Moderate drinking is defined as follows:  °· Men - no more than 2 drinks per day. °· Nonpregnant women - no more than   1 drink per day. °· Over age 65 - no more than 1 drink per day. °A standard drink is 12 grams of pure alcohol, which is equal to a 12 ounce bottle of beer or wine cooler, a 5 ounce glass of wine, or 1.5 ounces of distilled spirits (such as whiskey, brandy, vodka, or rum).  °ABSTAIN FROM (DO NOT DRINK) ALCOHOL: °· When pregnant or considering pregnancy. °· When taking a medication that interacts with alcohol. °· If you are alcohol dependent. °· A medical condition that prohibits drinking alcohol (such as ulcer, liver disease, or heart disease). °DISCUSS WITH YOUR  CAREGIVER: °· If you are at risk for coronary heart disease, discuss the potential benefits and risks of alcohol use: Light to moderate drinking is associated with lower rates of coronary heart disease in certain populations (for example, men over age 45 and postmenopausal women). Infrequent or nondrinkers are advised not to begin light to moderate drinking to reduce the risk of coronary heart disease so as to avoid creating an alcohol-related problem. Similar protective effects can likely be gained through proper diet and exercise. °· Women and the elderly have smaller amounts of body water than men. As a result women and the elderly achieve a higher blood alcohol concentration after drinking the same amount of alcohol. °· Exposing a fetus to alcohol can cause a broad range of birth defects referred to as Fetal Alcohol Syndrome (FAS) or Alcohol-Related Birth Defects (ARBD). Although FAS/ARBD is connected with excessive alcohol consumption during pregnancy, studies also have reported neurobehavioral problems in infants born to mothers reporting drinking an average of 1 drink per day during pregnancy. °· Heavier drinking (the consumption of more than 4 drinks per occasion by men and more than 3 drinks per occasion by women) impairs learning (cognitive) and psychomotor functions and increases the risk of alcohol-related problems, including accidents and injuries. °CAGE QUESTIONS:  °· Have you ever felt that you should Cut down on your drinking? °· Have people Annoyed you by criticizing your drinking? °· Have you ever felt bad or Guilty about your drinking? °· Have you ever had a drink first thing in the morning to steady your nerves or get rid of a hangover (Eye opener)? °If you answered positively to any of these questions: You may be at risk for alcohol-related problems if alcohol consumption is:  °· Men: Greater than 14 drinks per week or more than 4 drinks per occasion. °· Women: Greater than 7 drinks per week or  more than 3 drinks per occasion. °Do you or your family have a medical history of alcohol-related problems, such as: °· Blackouts. °· Sexual dysfunction. °· Depression. °· Trauma. °· Liver dysfunction. °· Sleep disorders. °· Hypertension. °· Chronic abdominal pain. °· Has your drinking ever caused you problems, such as problems with your family, problems with your work (or school) performance, or accidents/injuries? °· Do you have a compulsion to drink or a preoccupation with drinking? °· Do you have poor control or are you unable to stop drinking once you have started? °· Do you have to drink to avoid withdrawal symptoms? °· Do you have problems with withdrawal such as tremors, nausea, sweats, or mood disturbances? °· Does it take more alcohol than in the past to get you high? °· Do you feel a strong urge to drink? °· Do you change your plans so that you can have a drink? °· Do you ever drink in the morning to relieve the shakes or a hangover? °If you   have answered a number of the previous questions positively, it may be time for you to talk to your caregivers, family, and friends and see if they think you have a problem. Alcoholism is a chemical dependency that keeps getting worse and will eventually destroy your health and relationships. Many alcoholics end up dead, impoverished, or in prison. This is often the end result of all chemical dependency.  Do not be discouraged if you are not ready to take action immediately.  Decisions to change behavior often involve up and down desires to change and feeling like you cannot decide.  Try to think more seriously about your drinking behavior.  Think of the reasons to quit. WHERE TO GO FOR ADDITIONAL INFORMATION   The [place] on Alcohol Abuse and Alcoholism (NIAAA) http://www.bradshaw.com/  [employer] on Alcoholism and Drug Dependence (NCADD) www.ncadd.[place] (ASAM) http://carpenter.net/  Document Released:  09/21/2005 Document Revised: 12/14/2011 Document Reviewed: 05/09/2008 [facility] Patient Information 2014 [place].

## 2014-08-10 ENCOUNTER — Emergency Department (HOSPITAL_COMMUNITY)
Admission: EM | Admit: 2014-08-10 | Discharge: 2014-08-10 | Disposition: A | Discharge status: Home / Self Care | Payer: Self-pay | Attending: Emergency Medicine | Admitting: Emergency Medicine

## 2014-08-10 ENCOUNTER — Encounter (HOSPITAL_COMMUNITY): Payer: Self-pay | Admitting: *Deleted

## 2014-08-10 ENCOUNTER — Emergency Department (HOSPITAL_COMMUNITY): Payer: Self-pay

## 2014-08-10 DIAGNOSIS — Z86018 Personal history of other benign neoplasm: Secondary | ICD-10-CM | POA: Insufficient documentation

## 2014-08-10 DIAGNOSIS — R600 Localized edema: Secondary | ICD-10-CM | POA: Insufficient documentation

## 2014-08-10 DIAGNOSIS — H52209 Unspecified astigmatism, unspecified eye: Secondary | ICD-10-CM | POA: Insufficient documentation

## 2014-08-10 DIAGNOSIS — J3489 Other specified disorders of nose and nasal sinuses: Secondary | ICD-10-CM | POA: Insufficient documentation

## 2014-08-10 DIAGNOSIS — Z8701 Personal history of pneumonia (recurrent): Secondary | ICD-10-CM | POA: Insufficient documentation

## 2014-08-10 DIAGNOSIS — Z9889 Other specified postprocedural states: Secondary | ICD-10-CM | POA: Insufficient documentation

## 2014-08-10 DIAGNOSIS — R51 Headache: Secondary | ICD-10-CM | POA: Insufficient documentation

## 2014-08-10 DIAGNOSIS — R0982 Postnasal drip: Secondary | ICD-10-CM | POA: Insufficient documentation

## 2014-08-10 DIAGNOSIS — R05 Cough: Secondary | ICD-10-CM | POA: Insufficient documentation

## 2014-08-10 DIAGNOSIS — R519 Headache, unspecified: Secondary | ICD-10-CM

## 2014-08-10 DIAGNOSIS — H539 Unspecified visual disturbance: Secondary | ICD-10-CM | POA: Insufficient documentation

## 2014-08-10 DIAGNOSIS — G8929 Other chronic pain: Secondary | ICD-10-CM

## 2014-08-10 DIAGNOSIS — Z720 Tobacco use: Secondary | ICD-10-CM | POA: Insufficient documentation

## 2014-08-10 DIAGNOSIS — R350 Frequency of micturition: Secondary | ICD-10-CM | POA: Insufficient documentation

## 2014-08-10 HISTORY — DX: Pneumonia, unspecified organism: J18.9

## 2014-08-10 LAB — CBC WITH DIFFERENTIAL/PLATELET
Basophils Absolute: 0 10*3/uL (ref 0.0–0.1)
Basophils Relative: 1 % (ref 0–1)
Eosinophils Absolute: 0.2 10*3/uL (ref 0.0–0.7)
Eosinophils Relative: 2 % (ref 0–5)
HCT: 41.5 % (ref 39.0–52.0)
Hemoglobin: 13.8 g/dL (ref 13.0–17.0)
LYMPHS ABS: 1.4 10*3/uL (ref 0.7–4.0)
LYMPHS PCT: 21 % (ref 12–46)
MCH: 30 pg (ref 26.0–34.0)
MCHC: 33.3 g/dL (ref 30.0–36.0)
MCV: 90.2 fL (ref 78.0–100.0)
Monocytes Absolute: 0.5 10*3/uL (ref 0.1–1.0)
Monocytes Relative: 7 % (ref 3–12)
NEUTROS PCT: 69 % (ref 43–77)
Neutro Abs: 4.9 10*3/uL (ref 1.7–7.7)
PLATELETS: 150 10*3/uL (ref 150–400)
RBC: 4.6 MIL/uL (ref 4.22–5.81)
RDW: 13.2 % (ref 11.5–15.5)
WBC: 6.9 10*3/uL (ref 4.0–10.5)

## 2014-08-10 LAB — COMPREHENSIVE METABOLIC PANEL
ALT: 13 U/L (ref 0–53)
AST: 16 U/L (ref 0–37)
Albumin: 3.5 g/dL (ref 3.5–5.2)
Alkaline Phosphatase: 76 U/L (ref 39–117)
Anion gap: 12 (ref 5–15)
BUN: 12 mg/dL (ref 6–23)
CALCIUM: 8.7 mg/dL (ref 8.4–10.5)
CO2: 23 meq/L (ref 19–32)
Chloride: 105 mEq/L (ref 96–112)
Creatinine, Ser: 0.8 mg/dL (ref 0.50–1.35)
GLUCOSE: 85 mg/dL (ref 70–99)
Potassium: 4.7 mEq/L (ref 3.7–5.3)
Sodium: 140 mEq/L (ref 137–147)
Total Bilirubin: 0.2 mg/dL — ABNORMAL LOW (ref 0.3–1.2)
Total Protein: 7 g/dL (ref 6.0–8.3)

## 2014-08-10 LAB — I-STAT CHEM 8, ED
BUN: 10 mg/dL (ref 6–23)
CREATININE: 0.8 mg/dL (ref 0.50–1.35)
Calcium, Ion: 1.15 mmol/L (ref 1.12–1.23)
Chloride: 107 mEq/L (ref 96–112)
Glucose, Bld: 84 mg/dL (ref 70–99)
HCT: 45 % (ref 39.0–52.0)
HEMOGLOBIN: 15.3 g/dL (ref 13.0–17.0)
POTASSIUM: 4.4 meq/L (ref 3.7–5.3)
SODIUM: 139 meq/L (ref 137–147)
TCO2: 22 mmol/L (ref 0–100)

## 2014-08-10 LAB — CBG MONITORING, ED: Glucose-Capillary: 91 mg/dL (ref 70–99)

## 2014-08-10 MED ORDER — DEXAMETHASONE SODIUM PHOSPHATE 10 MG/ML IJ SOLN
10.0000 mg | Freq: Once | INTRAMUSCULAR | Status: AC
  Administered 2014-08-10: 10 mg via INTRAVENOUS
  Filled 2014-08-10: qty 1

## 2014-08-10 MED ORDER — TRAMADOL HCL 50 MG PO TABS: 50.0000 mg | ORAL_TABLET | Freq: Four times a day (QID) | ORAL | Status: DC | PRN

## 2014-08-10 MED ORDER — SODIUM CHLORIDE 0.9 % IV BOLUS (SEPSIS)
1000.0000 mL | Freq: Once | INTRAVENOUS | Status: AC
  Administered 2014-08-10: 1000 mL via INTRAVENOUS

## 2014-08-10 MED ORDER — IOHEXOL 300 MG/ML  SOLN
75.0000 mL | Freq: Once | INTRAMUSCULAR | Status: AC | PRN
  Administered 2014-08-10: 75 mL via INTRAVENOUS

## 2014-08-10 MED ORDER — HYDROMORPHONE HCL 1 MG/ML IJ SOLN
1.0000 mg | Freq: Once | INTRAMUSCULAR | Status: AC
  Administered 2014-08-10: 1 mg via INTRAVENOUS
  Filled 2014-08-10: qty 1

## 2014-08-10 MED ORDER — ONDANSETRON HCL 4 MG/2ML IJ SOLN
4.0000 mg | Freq: Once | INTRAMUSCULAR | Status: AC
  Administered 2014-08-10: 4 mg via INTRAVENOUS
  Filled 2014-08-10: qty 2

## 2014-08-10 NOTE — Discharge Instructions (Signed)
Please call Dr Janace Hoard for your ear nose and throat appointment. You have an appointment at the [place] [facility] on Monday at 11:00AM  You are having a headache. No specific cause was found today for your headache. It may have been a migraine or other cause of headache. Stress, anxiety, fatigue, and depression are common triggers for headaches. Your headache today  SEEK MEDICAL ATTENTION IF: You develop possible problems with medications prescribed.  The medications don't resolve your headache, if it recurs , or if you have multiple episodes of vomiting or can't take fluids. You have a change from the usual headache. RETURN IMMEDIATELY IF you develop a sudden, severe headache or confusion, become poorly responsive or faint, develop a fever above 100.67F or problem breathing, have a change in speech, vision, swallowing, or understanding, or develop new weakness, numbness, tingling, incoordination, or have a seizure.

## 2014-08-10 NOTE — ED Provider Notes (Signed)
CSN: [id number]     Arrival date & time 08/10/14  4709 History   First MD Initiated Contact with Patient 08/10/14 [phone]     Chief Complaint  Patient presents with  . Headache     (Consider location/radiation/quality/duration/timing/severity/associated sxs/prior Treatment) HPI  [name] is a(n) 53 y.o. male who presents chief complaint of facial swelling, pain and severe headaches. The patient has a past medical history of seizure disorder and pituitary tumor. He is uninsured and currently has no medical coverage, outpatient follow-up, and is on no medications at this time. The patient had his pituitary tumor excised about 2-3 years ago in [place]. The patient complains today of a swelling in the left side of his nose which has been progressively worsening over the past year. He notes swelling in the face, eyes, and into the nasopharynx. He has had increasingly worsening and severe headaches. He has noted foul smelling, bloody and sometimes black-colored discharged on the back of his throat. He often has episodes of apnea and snoring at night. He states he cannot lie flat because of this. He frequently chokes and vomits on the discharge. He states he's noticed worsening vision over the past year however he has a history of astigmatism. He complains of a cough. He denies fevers, unintended weight loss, soaking night sweats. He admits to drinking one beer a week however his wife states it's more like 6 beers daily. He has a chronic daily smoker. 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 He does complain of frequent urination and thirst however denies a history of known diabetes. Denies fevers, chills, myalgias, arthralgias. Denies DOE, SOB, chest tightness or pressure, radiation to left arm, jaw or back, or diaphoresis. Denies dysuria, flank pain, suprapubic pain,  frequency, urgency, or hematuria. Denies headaches, light headedness, weakness, visual disturbances. Denies abdominal pain, nausea, vomiting, diarrhea or constipation.       Past Medical History  Diagnosis Date  . Seizures   . Pneumonia    Past Surgical History  Procedure Laterality Date  . Brain tumor excision     Family History  Problem Relation Age of Onset  . Alcoholism Mother   . Alcoholism Father    History  Substance Use Topics  . Smoking status: Current Some Day Smoker  . Smokeless tobacco: Not on file  . Alcohol Use: 0.6 oz/week    1 Cans of beer per week    Review of Systems  Constitutional: Negative for fever and chills.  HENT: Positive for congestion, facial swelling, postnasal drip, rhinorrhea and sinus pressure.   Eyes: Positive for visual disturbance. Negative for pain and discharge.  Respiratory: Positive for cough.   Gastrointestinal: Positive for vomiting.  Genitourinary: Positive for frequency.  Skin: Negative for rash.  Neurological: Positive for headaches.  Psychiatric/Behavioral: Negative for confusion.  All other systems reviewed and are negative.     Allergies  Borax  Home Medications   Prior to Admission medications   Not on File   BP 153/89 mmHg  Pulse 86  Temp(Src) 97.4 F (36.3 C) (Oral)  Resp 20  Ht 5\' 9"  (1.753 m)  Wt 164 lb 8 oz (74.617 kg)  BMI 24.28 kg/m2  SpO2 100% Physical Exam  Constitutional: He is oriented to person, place, and time. He appears well-developed and well-nourished. No distress.  HENT:  Head: Normocephalic and atraumatic.  Nose:  Mucosal edema and sinus tenderness present. Right sinus exhibits maxillary sinus tenderness and frontal sinus tenderness. Left sinus exhibits frontal sinus tenderness.    Mouth/Throat: Oropharynx is clear and moist.    Eyes: EOM are normal. Pupils are equal, round, and reactive to light. No scleral icterus.  Horizontal nystagmus  Neck: Normal range of motion. Neck  supple.  Full active and passive ROM without pain No midline or paraspinal tenderness No nuchal rigidity or meningeal signs  Cardiovascular: Normal rate, regular rhythm and intact distal pulses.   Pulmonary/Chest: Effort normal and breath sounds normal. No respiratory distress. He has no wheezes. He has no rales.  Abdominal: Soft. Bowel sounds are normal. There is no tenderness. There is no rebound and no guarding.  Musculoskeletal: Normal range of motion.  Lymphadenopathy:    He has no cervical adenopathy.  Neurological: He is alert and oriented to person, place, and time. He has normal reflexes. No cranial nerve deficit. He exhibits normal muscle tone. Coordination normal.  Mental Status:  Alert, oriented, thought content appropriate. Speech fluent without evidence of aphasia. Able to follow 2 step commands without difficulty.  Cranial Nerves:  II:  Peripheral visual fields grossly normal, pupils equal, round, reactive to light III,IV, VI: ptosis not present, extra-ocular motions intact bilaterally  V,VII: smile symmetric, facial light touch sensation equal VIII: hearing grossly normal bilaterally  IX,X: gag reflex present  XI: bilateral shoulder shrug equal and strong XII: midline tongue extension  Motor:  5/5 in upper and lower extremities bilaterally including strong and equal grip strength and dorsiflexion/plantar flexion Sensory: Pinprick and light touch normal in all extremities.  Deep Tendon Reflexes: 2+ and symmetric  Cerebellar: normal finger-to-nose with bilateral upper extremities Gait: normal gait and balance CV: distal pulses palpable throughout   Skin: Skin is warm and dry. No rash noted. He is not diaphoretic.  Psychiatric: He has a normal mood and affect. His behavior is normal. Judgment and thought content normal.  Nursing note and vitals reviewed.   ED Course  Procedures (including critical care time) Labs Review Labs Reviewed  COMPREHENSIVE METABOLIC PANEL    CBC WITH DIFFERENTIAL  I-STAT CHEM 8, ED  CBG MONITORING, ED    Imaging Review No results found.   EKG Interpretation None      MDM   Final diagnoses:  Facial pain    10:09 AM BP 153/89 mmHg  Pulse 86  Temp(Src) 97.4 F (36.3 C) (Oral)  Resp 20  Ht 5\' 9"  (1.753 m)  Wt 164 lb 8 oz (74.617 kg)  BMI 24.28 kg/m2  SpO2 100% Patient seen in shared visit with attending physician. Patient with swelling in the nasal cavity. Facial swelling tenderness. No evidence of mucormycosis. Doubt cavernous venous thrombosis due to length and slowly progressively worsening symptoms over 1 year. We'll evaluate with CT maxillofacial with contrast media as well as a CT of the head.   Patient islarge mass extending from the left maxillary sinus into the nasal cavity. I spoke with Dr. Janace Hoard of ENT who states the patient will need a biopsy. He has contacted his office staff let them that the patient should be seen in follow-up from the emergency department. I have also contacted with care management has obtained a follow-up appointment for the patient at 11 AM on the following Monday morning in 3 days. Patient headache treated here in the emergency department I feel his headaches are directly related to sinus congestion and the mass that is present. Patient discharged with  Tramadol    Margarita Mail, PA-C 08/10/14 1654

## 2014-08-10 NOTE — ED Provider Notes (Signed)
4:55 PM I discharged the patient earlier with a prescription for Tramadol, however feel that he should not take this medication due to his history of seizures, ETOH abuse. I called the patient and spoke with him and his wife who had not yet filled the prescription. I advised the patient not to take the medication and I called in a rx for Naproxen sodium 500 po BID PRN pain at [facility] on [address]. Patient advised to destroy RX for tramadol.   Margarita Mail, PA-C 08/10/14 1658

## 2014-08-10 NOTE — ED Provider Notes (Signed)
Medical screening examination/treatment/procedure(s) were conducted as a shared visit with non-physician practitioner(s) and myself.  I personally evaluated the patient during the encounter.   EKG Interpretation None      Pt is a 53 y.o. M with history of pituitary adenoma that was resected 2-3 years ago and [facility] who presents emergency department with one year of left-sided facial pain, purulent thick drainage from the left nostril and down the back of his throat, headaches, subjective fevers. No numbness continuing or focal weakness. Denies any trauma to this area. Denies putting anything into his nose. No history of diabetes. On exam, patient is hemodynamically stable, afebrile, nontoxic-appearing, neurologically intact. He does have thick, yellow bloody appearing secretions in the left nostril as well as draining into the posterior oropharynx. He is tender to palpation over the left maxillary region without erythema or warmth or induration or fluctuance. No tonsillar hypertrophy or exudate, uvula deviation. No meningismus.  Heart and lung sounds normal.  CT scan shows a mass lesion filling the left maxillary sinus with extension through the medial wall of the sinus into the nasal cavity with chronic sinusitis.   ENT will see patient as an outpatient. Patient has very poor access to care with no health insurance. We'll have case management see the patient so that we can provide him assistance but he does not get lost to follow-up.  [place], DO 08/10/14 1343

## 2014-08-10 NOTE — Care Management (Signed)
CARE MANAGEMENT ED NOTE 08/10/2014  Patient:  [name], [name]   Account Number:  [id number]  Date Initiated:  08/10/2014  Documentation initiated by:  [facility]  Subjective/Objective Assessment:   53 y.o. M with history of pituitary adenoma that was resected 2-3 years ago and [facility] who presents emergency department with one year of left-sided facial pain, purulent thick drainage from the left nostril and down the back of his throat.     Subjective/Objective Assessment Detail:   The patient has a past medical history of seizure disorder and pituitary tumor. He is uninsured and currently has no medical coverage, outpatient follow-up, and is on no medications at this time.  He notes swelling in the face, eyes, and into the nasopharynx. He has had increasingly worsening and severe headaches. He has noted foul smelling, bloody and sometimes black-colored discharged on the back of his throat. He often has episodes of apnea and snoring at night. He states he cannot lie flat because of this. He frequently chokes and vomits on the discharge. He states he's noticed worsening vision over the past year however he has a history of astigmatism.  Lives at home with girlfriend and children.     Action/Plan:   CT scan shows a mass lesion filling the left maxillary sinus with extension through the medial wall of the sinus into the nasal cavity with chronic sinusitis.   ENT will see patient as an outpatient.   Action/Plan Detail:   NCM set up appoint with [facility] for Monday 11/9 @ 1130 AM. Spoke with pt at bedside and provided brochure with directions and phone number highlighted.  Pt verbalizes understanding of keeping appointment.   Anticipated DC Date:  08/10/2014     Status Recommendation to Physician:   Result of Recommendation:     In-house referral  PCP / [place]  CM consult   [facility] Choice  NA   Choice offered to / List presented to:            Status of  service:  Completed, signed off

## 2014-08-10 NOTE — ED Notes (Signed)
Pt reports hx of brain tumor and pneumonia. Pt reports that having a "knot" to left nare that is blocking his breathing and having severe headaches. Also reports hx of pneumonia and pt thinks it has not resolved. No acute distress noted at triage.

## 2014-08-13 ENCOUNTER — Ambulatory Visit: Payer: Self-pay | Attending: Internal Medicine

## 2014-08-13 VITALS — BP 154/89 | HR 61 | Temp 98.2°F | Resp 17 | Ht 63.0 in | Wt 171.2 lb

## 2014-08-13 DIAGNOSIS — D491 Neoplasm of unspecified behavior of respiratory system: Secondary | ICD-10-CM

## 2014-08-13 MED ORDER — OXYMETAZOLINE HCL 0.05 % NA SOLN: 1.0000 | Freq: Two times a day (BID) | NASAL | Status: AC

## 2014-08-13 MED ORDER — FLUTICASONE PROPIONATE 50 MCG/ACT NA SUSP: 2.0000 | Freq: Every day | NASAL | Status: DC

## 2014-08-13 MED ORDER — FOLIC ACID 1 MG PO TABS: 1.0000 mg | ORAL_TABLET | Freq: Every day | ORAL | Status: DC

## 2014-08-13 MED ORDER — VITAMIN B-1 100 MG PO TABS: 100.0000 mg | ORAL_TABLET | Freq: Every day | ORAL | Status: DC

## 2014-08-13 MED ORDER — AMOXICILLIN-POT CLAVULANATE 875-125 MG PO TABS: 1.0000 | ORAL_TABLET | Freq: Two times a day (BID) | ORAL | Status: AC

## 2014-08-13 NOTE — Care Management ED Note (Addendum)
      CARE MANAGEMENT ED NOTE 08/10/2014  Patient:  [name], [name]   Account Number:  [id number]  Date Initiated:  08/10/2014  Documentation initiated by:  [facility]  Subjective/Objective Assessment:   53 y.o. M with history of pituitary adenoma that was resected 2-3 years ago and [facility] who presents emergency department with one year of left-sided facial pain, purulent thick drainage from the left nostril and down the back of his throat.     Subjective/Objective Assessment Detail:   The patient has a past medical history of seizure disorder and pituitary tumor. He is uninsured and currently has no medical coverage, outpatient follow-up, and is on no medications at this time.  He notes swelling in the face, eyes, and into the nasopharynx. He has had increasingly worsening and severe headaches. He has noted foul smelling, bloody and sometimes black-colored discharged on the back of his throat. He often has episodes of apnea and snoring at night. He states he cannot lie flat because of this. He frequently chokes and vomits on the discharge. He states he's noticed worsening vision over the past year however he has a history of astigmatism.  Lives at home with girlfriend and children.     Action/Plan:   CT scan shows a mass lesion filling the left maxillary sinus with extension through the medial wall of the sinus into the nasal cavity with chronic sinusitis.   ENT will see patient as an outpatient.   Action/Plan Detail:   NCM set up appoint with [facility] for Monday 11/9 @ 1130 AM. Spoke with pt at bedside and provided brochure with directions and phone number highlighted.  Pt verbalizes understanding of keeping appointment.   Anticipated DC Date:  08/10/2014     Status Recommendation to Physician:   Result of Recommendation:     In-house referral  PCP / [place]  CM consult   [facility] Choice  NA   Choice offered to / List presented to:             Status of service:  Completed, signed off  ED Comments:   ED Comments Detail:

## 2014-08-13 NOTE — Progress Notes (Unsigned)
Patient ID: [name], male   DOB: 09/19/1961, 53 y.o.   MRN: [id number]   CC:  HPI:  53 y.o. M with history of pituitary adenoma (as per our records the patient had a mucocele in his right nostril)that was resected 2-3 years ago at [facility] in [place]. Patient unable to recall the name of his neurosurgeon. Patient was told that this tumor might come back. He presented to emergency department on 11/6 with one year of left-sided facial pain, purulent thick drainage from the left nostril and down the back of his throat, headaches, subjective fevers. Unable to sleep at night because of difficulty breathing.  . Denies putting anything into his nose. No history of diabetes. On exam, patient is hemodynamically stable, afebrile, nontoxic-appearing, neurologically intact. He does have thick, yellow bloody appearing secretions in the left nostril as well as draining into the posterior oropharynx. He is tender to palpation over the left maxillary region without erythema or warmth or induration or fluctuance.  No tonsillar hypertrophy or exudate, uvula deviation. No meningismus. Heart and lung sounds normal. CT scan shows a mass lesion filling the left maxillary sinus with extension through the medial wall of the sinus into the nasal cavity with chronic sinusitis. ENT Dr. Janace Hoard recommended a biopsy. The patient was scheduled for ENT follow-up on 11/10. Patient states that he has $100 co-pay.   Patient has very poor access to care with no health insurance.   He has a history of alcohol abuse, seizure disorder, has not been on an antiseizure medication ( phenobarbital) for almost 2 years. He has had one seizure in the last 2 years. He also the history of subdural hematoma requiring admission and to the ICU in 2011 because of his alcohol abuse  Allergies  Allergen Reactions  . Borax Anaphylaxis   Past Medical History  Diagnosis Date  . Seizures   . Pneumonia    Current Outpatient  Prescriptions on File Prior to Visit  Medication Sig Dispense Refill  . diphenhydramine-acetaminophen (TYLENOL PM) 25-500 MG TABS Take 2 tablets by mouth at bedtime as needed.    Marland Kitchen PHENobarbital (LUMINAL) 64.8 MG tablet Take 64.8 mg by mouth 2 (two) times daily.    . traMADol (ULTRAM) 50 MG tablet Take 1 tablet (50 mg total) by mouth every 6 (six) hours as needed. 15 tablet 0   No current facility-administered medications on file prior to visit.   Family History  Problem Relation Age of Onset  . Alcoholism Mother   . Alcoholism Father    History   Social History  . Marital Status: Single    Spouse Name: N/A    Number of Children: N/A  . Years of Education: N/A   Occupational History  . Not on file.   Social History Main Topics  . Smoking status: Current Some Day Smoker  . Smokeless tobacco: Not on file  . Alcohol Use: 0.6 oz/week    1 Cans of beer per week  . Drug Use: No  . Sexual Activity: Not on file   Other Topics Concern  . Not on file   Social History Narrative    Review of Systems  Constitutional: Negative for fever, chills, diaphoresis, activity change, appetite change and fatigue.  HENT: Negative for ear pain, positive fornosebleeds and congestion, facial swelling, positive for rhinorrhea, neck pain, neck stiffness and ear discharge.   Eyes: Negative for pain, discharge, redness, itching and visual disturbance.  Respiratory: Negative for cough, choking, chest tightness, shortness  of breath, wheezing and stridor.   Cardiovascular: Negative for chest pain, palpitations and leg swelling.  Gastrointestinal: Negative for abdominal distention.  Genitourinary: Negative for dysuria, urgency, frequency, hematuria, flank pain, decreased urine volume, difficulty urinating and dyspareunia.  Musculoskeletal: Negative for back pain, joint swelling, arthralgias and gait problem.  Neurological: Negative for dizziness, tremors, seizures, syncope, facial asymmetry, speech  difficulty, weakness, light-headedness, numbness and headaches.  Hematological: Negative for adenopathy. Does not bruise/bleed easily.  Psychiatric/Behavioral: Negative for hallucinations, behavioral problems, confusion, dysphoric mood, decreased concentration and agitation.    Objective:   Filed Vitals:   08/13/14 1115  BP: 154/89  Pulse: 61  Temp: 98.2 F (36.8 C)  Resp: 17    Physical Exam  Constitutional: Appears well-developed and well-nourished. No distress.  HENT: left nostril has a soft pale appearing polyp, without any bleeding, discharge Eyes: Conjunctivae and EOM are normal. PERRLA, no scleral icterus.  Neck: Normal ROM. Neck supple. No JVD. No tracheal deviation. No thyromegaly.  CVS: RRR, S1/S2 +, no murmurs, no gallops, no carotid bruit.  Pulmonary: Effort and breath sounds normal, no stridor, rhonchi, wheezes, rales.  Abdominal: Soft. BS +,  no distension, tenderness, rebound or guarding.  Musculoskeletal: Normal range of motion. No edema and no tenderness.  Lymphadenopathy: No lymphadenopathy noted, cervical, inguinal. Neuro: Alert. Normal reflexes, muscle tone coordination. No cranial nerve deficit. Skin: Skin is warm and dry. No rash noted. Not diaphoretic. No erythema. No pallor.  Psychiatric: Normal mood and affect. Behavior, judgment, thought content normal.   Lab Results  Component Value Date   WBC 6.9 08/10/2014   HGB 15.3 08/10/2014   HCT 45.0 08/10/2014   MCV 90.2 08/10/2014   PLT 150 08/10/2014   Lab Results  Component Value Date   CREATININE 0.80 08/10/2014   BUN 10 08/10/2014   NA 139 08/10/2014   K 4.4 08/10/2014   CL 107 08/10/2014   CO2 23 08/10/2014    No results found for: HGBA1C Lipid Panel  No results found for: CHOL, TRIG, HDL, CHOLHDL, VLDL, LDLCALC     Assessment and plan:   There are no active problems to display for this patient.  Tumor of the left nostril Likely benign, probably a mucocele Will need a biopsy ENT  follow-up scheduled for 11/10 Patient encouraged to keep this appointment Started on Augmentin for 3 weeks for chronic sinusitis Also started on Flonase because of nasal congestion and difficulty breathing  History of seizure disorder Patient was on phenobarbital He is at risk of alcohol withdrawal seizures, for now he drinks on a daily basis  Alcohol abuse We'll start the patient on thiamine and folic acid Social work consultation for alcohol cessation      The patient was given clear instructions to go to ER or return to medical center if symptoms don't improve, worsen or new problems develop. The patient verbalized understanding. The patient was told to call to get any lab results if not heard anything in the next week.

## 2014-08-13 NOTE — Progress Notes (Unsigned)
Pt presents to [facility] for follow-up, left nose tumor. Pt states he has difficulty breathing when sleeping and that he gets sleep apnea/choking. Pt alerts and oriented at this time.

## 2014-08-21 ENCOUNTER — Other Ambulatory Visit: Payer: Self-pay

## 2014-09-13 ENCOUNTER — Ambulatory Visit: Payer: Self-pay | Admitting: Internal Medicine

## 2015-04-08 ENCOUNTER — Encounter (HOSPITAL_COMMUNITY): Payer: Self-pay | Admitting: Adult Health

## 2015-04-08 ENCOUNTER — Emergency Department (HOSPITAL_COMMUNITY)
Admission: EM | Admit: 2015-04-08 | Discharge: 2015-04-08 | Disposition: A | Discharge status: Home / Self Care | Payer: Medicaid Other | Attending: Emergency Medicine | Admitting: Emergency Medicine

## 2015-04-08 ENCOUNTER — Emergency Department (HOSPITAL_COMMUNITY): Payer: Medicaid Other

## 2015-04-08 DIAGNOSIS — R569 Unspecified convulsions: Secondary | ICD-10-CM | POA: Diagnosis not present

## 2015-04-08 DIAGNOSIS — Z720 Tobacco use: Secondary | ICD-10-CM | POA: Diagnosis not present

## 2015-04-08 DIAGNOSIS — G40909 Epilepsy, unspecified, not intractable, without status epilepticus: Secondary | ICD-10-CM

## 2015-04-08 DIAGNOSIS — G588 Other specified mononeuropathies: Secondary | ICD-10-CM | POA: Insufficient documentation

## 2015-04-08 DIAGNOSIS — F10120 Alcohol abuse with intoxication, uncomplicated: Secondary | ICD-10-CM | POA: Insufficient documentation

## 2015-04-08 DIAGNOSIS — F10129 Alcohol abuse with intoxication, unspecified: Secondary | ICD-10-CM | POA: Diagnosis present

## 2015-04-08 DIAGNOSIS — R202 Paresthesia of skin: Secondary | ICD-10-CM | POA: Diagnosis not present

## 2015-04-08 DIAGNOSIS — Z7951 Long term (current) use of inhaled steroids: Secondary | ICD-10-CM | POA: Insufficient documentation

## 2015-04-08 DIAGNOSIS — D491 Neoplasm of unspecified behavior of respiratory system: Secondary | ICD-10-CM

## 2015-04-08 DIAGNOSIS — Z8701 Personal history of pneumonia (recurrent): Secondary | ICD-10-CM | POA: Diagnosis not present

## 2015-04-08 DIAGNOSIS — Z79899 Other long term (current) drug therapy: Secondary | ICD-10-CM | POA: Diagnosis not present

## 2015-04-08 DIAGNOSIS — F10920 Alcohol use, unspecified with intoxication, uncomplicated: Secondary | ICD-10-CM

## 2015-04-08 HISTORY — DX: Alcohol abuse, uncomplicated: F10.10

## 2015-04-08 LAB — BASIC METABOLIC PANEL
Anion gap: 12 (ref 5–15)
CALCIUM: 8.3 mg/dL — AB (ref 8.9–10.3)
CO2: 23 mmol/L (ref 22–32)
CREATININE: 0.85 mg/dL (ref 0.61–1.24)
Chloride: 109 mmol/L (ref 101–111)
GFR calc Af Amer: >60 mL/min (ref >60)
GFR calc non Af Amer: >60 mL/min (ref >60)
GLUCOSE: 101 mg/dL — AB (ref 65–99)
Potassium: 4.7 mmol/L (ref 3.5–5.1)
SODIUM: 144 mmol/L (ref 135–145)

## 2015-04-08 LAB — CBC WITH DIFFERENTIAL/PLATELET
BASOS ABS: 0 10*3/uL (ref 0.0–0.1)
Basophils Relative: 1 % (ref 0–1)
EOS ABS: 0.2 10*3/uL (ref 0.0–0.7)
EOS PCT: 2 % (ref 0–5)
HEMATOCRIT: 42.5 % (ref 39.0–52.0)
Hemoglobin: 14.6 g/dL (ref 13.0–17.0)
LYMPHS ABS: 2.3 10*3/uL (ref 0.7–4.0)
Lymphocytes Relative: 30 % (ref 12–46)
MCH: 31.1 pg (ref 26.0–34.0)
MCHC: 34.4 g/dL (ref 30.0–36.0)
MCV: 90.6 fL (ref 78.0–100.0)
Monocytes Absolute: 0.5 10*3/uL (ref 0.1–1.0)
Monocytes Relative: 6 % (ref 3–12)
NEUTROS PCT: 61 % (ref 43–77)
Neutro Abs: 4.6 10*3/uL (ref 1.7–7.7)
Platelets: 189 10*3/uL (ref 150–400)
RBC: 4.69 MIL/uL (ref 4.22–5.81)
RDW: 13.8 % (ref 11.5–15.5)
WBC: 7.5 10*3/uL (ref 4.0–10.5)

## 2015-04-08 MED ORDER — M.V.I. ADULT IV INJ
INJECTION | Freq: Once | INTRAVENOUS | Status: AC
  Administered 2015-04-08: 03:00:00 via INTRAVENOUS
  Filled 2015-04-08: qty 1000

## 2015-04-08 NOTE — Discharge Instructions (Signed)
Epilepsy Epilepsy is a disorder in which a person has repeated seizures over time. A seizure is a release of abnormal electrical activity in the brain. Seizures can cause a change in attention, behavior, or the ability to remain awake and alert (altered mental status). Seizures often involve uncontrollable shaking (convulsions).  Most people with epilepsy lead normal lives. However, people with epilepsy are at an increased risk of falls, accidents, and injuries. Therefore, it is important to begin treatment right away. CAUSES  Epilepsy has many possible causes. Anything that disturbs the normal pattern of brain cell activity can lead to seizures. This may include:   Head injury.  Birth trauma.  High fever as a child.  Stroke.  Bleeding into or around the brain.  Certain drugs.  Prolonged low oxygen, such as what occurs after CPR efforts.  Abnormal brain development.  Certain illnesses, such as meningitis, encephalitis (brain infection), malaria, and other infections.  An imbalance of nerve signaling chemicals (neurotransmitters).  SIGNS AND SYMPTOMS  The symptoms of a seizure can vary greatly from one person to another. Right before a seizure, you may have a warning (aura) that a seizure is about to occur. An aura may include the following symptoms:  Fear or anxiety.  Nausea.  Feeling like the room is spinning (vertigo).  Vision changes, such as seeing flashing lights or spots. Common symptoms during a seizure include:  Abnormal sensations, such as an abnormal smell or a bitter taste in the mouth.   Sudden, general body stiffness.   Convulsions that involve rhythmic jerking of the face, arm, or leg on one or both sides.   Sudden change in consciousness.   Appearing to be awake but not responding.   Appearing to be asleep but cannot be awakened.   Grimacing, chewing, lip smacking, drooling, tongue biting, or loss of bowel or bladder control. After a  seizure, you may feel sleepy for a while. DIAGNOSIS  Your health care provider will ask about your symptoms and take a medical history. Descriptions from any witnesses to your seizures will be very helpful in the diagnosis. A physical exam, including a detailed neurological exam, is necessary. Various tests may be done, such as:   An electroencephalogram (EEG). This is a painless test of your brain waves. In this test, a diagram is created of your brain waves. These diagrams can be interpreted by a specialist.  An MRI of the brain.   A CT scan of the brain.   A spinal tap (lumbar puncture, LP).  Blood tests to check for signs of infection or abnormal blood chemistry. TREATMENT  There is no cure for epilepsy, but it is generally treatable. Once epilepsy is diagnosed, it is important to begin treatment as soon as possible. For most people with epilepsy, seizures can be controlled with medicines. The following may also be used:  A pacemaker for the brain (vagus nerve stimulator) can be used for people with seizures that are not well controlled by medicine.  Surgery on the brain. For some people, epilepsy eventually goes away. HOME CARE INSTRUCTIONS   Follow your health care provider's recommendations on driving and safety in normal activities.  Get enough rest. Lack of sleep can cause seizures.  Only take over-the-counter or prescription medicines as directed by your health care provider. Take any prescribed medicine exactly as directed.  Avoid any known triggers of your seizures.  Keep a seizure diary. Record what you recall about any seizure, especially any possible trigger.  Make sure the people you live and work with know that you are prone to seizures. They should receive instructions on how to help you. In general, a witness to a seizure should:   Cushion your head and body.   Turn you on your side.   Avoid unnecessarily restraining you.   Not place anything inside  your mouth.   Call for emergency medical help if there is any question about what has occurred.   Follow up with your health care provider as directed. You may need regular blood tests to monitor the levels of your medicine.  SEEK MEDICAL CARE IF:   You develop signs of infection or other illness. This might increase the risk of a seizure.   You seem to be having more frequent seizures.   Your seizure pattern is changing.  SEEK IMMEDIATE MEDICAL CARE IF:   You have a seizure that does not stop after a few moments.   You have a seizure that causes any difficulty in breathing.   You have a seizure that results in a very severe headache.   You have a seizure that leaves you with the inability to speak or use a part of your body.  Document Released: 09/21/2005 Document Revised: 07/12/2013 Document Reviewed: 05/03/2013 [facility] Patient Information 2015 [place], [place]. This information is not intended to replace advice given to you by your health care provider. Make sure you discuss any questions you have with your health care provider.   Alcohol Use Disorder Alcohol use disorder is a mental disorder. It is not a one-time incident of heavy drinking. Alcohol use disorder is the excessive and uncontrollable use of alcohol over time that leads to problems with functioning in one or more areas of daily living. People with this disorder risk harming themselves and others when they drink to excess. Alcohol use disorder also can cause other mental disorders, such as mood and anxiety disorders, and serious physical problems. People with alcohol use disorder often misuse other drugs.  Alcohol use disorder is common and widespread. Some people with this disorder drink alcohol to cope with or escape from negative life events. Others drink to relieve chronic pain or symptoms of mental illness. People with a family history of alcohol use disorder are at higher risk of losing control and using  alcohol to excess.  SYMPTOMS  Signs and symptoms of alcohol use disorder may include the following:   Consumption ofalcohol inlarger amounts or over a longer period of time than intended.  Multiple unsuccessful attempts to cutdown or control alcohol use.   A great deal of time spent obtaining alcohol, using alcohol, or recovering from the effects of alcohol (hangover).  A strong desire or urge to use alcohol (cravings).   Continued use of alcohol despite problems at work, school, or home because of alcohol use.   Continued use of alcohol despite problems in relationships because of alcohol use.  Continued use of alcohol in situations when it is physically hazardous, such as driving a car.  Continued use of alcohol despite awareness of a physical or psychological problem that is likely related to alcohol use. Physical problems related to alcohol use can involve the brain, heart, liver, stomach, and intestines. Psychological problems related to alcohol use include intoxication, depression, anxiety, psychosis, delirium, and dementia.   The need for increased amounts of alcohol to achieve the same desired effect, or a decreased effect from the consumption of the same amount of alcohol (tolerance).  Withdrawal symptoms upon reducing  or stopping alcohol use, or alcohol use to reduce or avoid withdrawal symptoms. Withdrawal symptoms include:  Racing heart.  Hand tremor.  Difficulty sleeping.  Nausea.  Vomiting.  Hallucinations.  Restlessness.  Seizures. DIAGNOSIS Alcohol use disorder is diagnosed through an assessment by your health care provider. Your health care provider may start by asking three or four questions to screen for excessive or problematic alcohol use. To confirm a diagnosis of alcohol use disorder, at least two symptoms must be present within a 64-month period. The severity of alcohol use disorder depends on the number of symptoms:  Mild--two or  three.  Moderate--four or five.  Severe--six or more. Your health care provider may perform a physical exam or use results from lab tests to see if you have physical problems resulting from alcohol use. Your health care provider may refer you to a mental health professional for evaluation. TREATMENT  Some people with alcohol use disorder are able to reduce their alcohol use to low-risk levels. Some people with alcohol use disorder need to quit drinking alcohol. When necessary, mental health professionals with specialized training in substance use treatment can help. Your health care provider can help you decide how severe your alcohol use disorder is and what type of treatment you need. The following forms of treatment are available:   Detoxification. Detoxification involves the use of prescription medicines to prevent alcohol withdrawal symptoms in the first week after quitting. This is important for people with a history of symptoms of withdrawal and for heavy drinkers who are likely to have withdrawal symptoms. Alcohol withdrawal can be dangerous and, in severe cases, cause death. Detoxification is usually provided in a hospital or in-patient substance use treatment facility.  Counseling or talk therapy. Talk therapy is provided by substance use treatment counselors. It addresses the reasons people use alcohol and ways to keep them from drinking again. The goals of talk therapy are to help people with alcohol use disorder find healthy activities and ways to cope with life stress, to identify and avoid triggers for alcohol use, and to handle cravings, which can cause relapse.  Medicines.Different medicines can help treat alcohol use disorder through the following actions:  Decrease alcohol cravings.  Decrease the positive reward response felt from alcohol use.  Produce an uncomfortable physical reaction when alcohol is used (aversion therapy).  Support groups. Support groups are run by people  who have quit drinking. They provide emotional support, advice, and guidance. These forms of treatment are often combined. Some people with alcohol use disorder benefit from intensive combination treatment provided by specialized substance use treatment centers. Both inpatient and outpatient treatment programs are available. Document Released: 10/29/2004 Document Revised: 02/05/2014 Document Reviewed: 12/29/2012 [facility] Patient Information 2015 [place], [place]. This information is not intended to replace advice given to you by your health care provider. Make sure you discuss any questions you have with your health care provider.

## 2015-04-08 NOTE — ED Notes (Signed)
Patient family member ambulated patient to the restroom against this nurse request not too. MD witness patient ambulating. MD states patient ready to go home.

## 2015-04-08 NOTE — ED Provider Notes (Signed)
CSN: [id number]     Arrival date & time 04/08/15  0002 History   First MD Initiated Contact with Patient 04/08/15 0010     Chief Complaint  Patient presents with  . Seizures  . Alcohol Intoxication     (Consider location/radiation/quality/duration/timing/severity/associated sxs/prior Treatment) HPI Comments: Presents with seizure activity-per wife pt has been drinking all day and normally drinks a lot all day and needs rehab. He has been episodes of stiffening up and talking stating he is having a seizure. Denies pain. He has a known tumor in his nose that he has known about for some time, but has not attempted to get follow up care for. He drank at least a 12 pack of beer today.   The history is provided by the patient and the spouse.    Past Medical History  Diagnosis Date  . Seizures   . Pneumonia   . Alcohol abuse    Past Surgical History  Procedure Laterality Date  . Brain tumor excision     Family History  Problem Relation Age of Onset  . Alcoholism Mother   . Alcoholism Father    History  Substance Use Topics  . Smoking status: Current Some Day Smoker  . Smokeless tobacco: Not on file  . Alcohol Use: 0.6 oz/week    1 Cans of beer per week    Review of Systems  Constitutional: Negative for fever, activity change, appetite change and fatigue.  HENT: Negative for congestion, facial swelling, rhinorrhea and trouble swallowing.   Eyes: Negative for photophobia and pain.  Respiratory: Negative for cough, chest tightness and shortness of breath.   Cardiovascular: Negative for chest pain and leg swelling.  Gastrointestinal: Negative for nausea, vomiting, abdominal pain, diarrhea and constipation.  Endocrine: Negative for polydipsia and polyuria.  Genitourinary: Negative for dysuria, urgency, decreased urine volume and difficulty urinating.  Musculoskeletal: Negative for back pain and gait problem.  Skin: Negative for color change, rash and wound.    Allergic/Immunologic: Negative for immunocompromised state.  Neurological: Positive for seizures and numbness. Negative for dizziness, facial asymmetry, speech difficulty, weakness and headaches.  Psychiatric/Behavioral: Negative for confusion, decreased concentration and agitation.      Allergies  Borax  Home Medications   Prior to Admission medications   Medication Sig Start Date End Date Taking? Authorizing Provider  fluticasone (FLONASE) 50 MCG/ACT nasal spray Place 2 sprays into both nostrils daily. Patient not taking: Reported on 04/08/2015 08/13/14   Reyne Dumas, MD  folic acid (FOLVITE) 1 MG tablet Take 1 tablet (1 mg total) by mouth daily. Patient not taking: Reported on 04/08/2015 08/13/14   Reyne Dumas, MD  thiamine (VITAMIN B-1) 100 MG tablet Take 1 tablet (100 mg total) by mouth daily. Patient not taking: Reported on 04/08/2015 08/13/14   Reyne Dumas, MD  traMADol (ULTRAM) 50 MG tablet Take 1 tablet (50 mg total) by mouth every 6 (six) hours as needed. Patient not taking: Reported on 04/08/2015 08/10/14   Margarita Mail, PA-C   BP 129/71 mmHg  Pulse 90  Temp(Src) 97.4 F (36.3 C) (Oral)  Resp 15  SpO2 94% Physical Exam  Constitutional:  intoxicated  HENT:  Nose:    Neurological: He displays no tremor. A sensory deficit is present. No cranial nerve deficit. He exhibits normal muscle tone. GCS eye subscore is 4. GCS verbal subscore is 5. GCS motor subscore is 6.  Paresthesias to L face, L arm L leg, reported ot be present for 1 year.  ED Course  Procedures (including critical care time) Labs Review Labs Reviewed  BASIC METABOLIC PANEL - Abnormal; Notable for the following:    Glucose, Bld 101 (*)    BUN <5 (*)    Calcium 8.3 (*)    All other components within normal limits  CBC WITH DIFFERENTIAL/PLATELET    Imaging Review No results found.   EKG Interpretation None      MDM   Final diagnoses:  Alcohol intoxication, uncomplicated  Nasal sinus  tumor  Paresthesias  Seizure disorder    Pt is a 54 y.o. male with Pmhx as above who presents with alcohol intoxication. And possible seizure-like activity at home. Pt has hx of seizures. Wife reports he has been drinking heavily, also has known sinus tumor diagnosed years ago that pt has not follow up for. On exam, pt is too intoxicated to participate with hx or neuro exam. He has soft tissue mass from L nare. Pt will be given a banana bag and will be allowed ot metabolize ETOH.   11:42 AM Pt's wife has walked him to the bathroom, on repeat exam, pt has no acute neuro findings, rpeorts paresthesias of L side for about 1 year. . CT head with stable sixe of L maxillary sinus and nasal cavity mass, no intracranial findings. Doubt this is cause of seizure. No acute lab findings. Pt does not meet criteria for inpt ETOH detox, and I do not think would be here unless escorted by wife. I have left message for SW to f/u, has given resources for ETOH abuse programs, [facility], as well as referrals for ENT and neurology.    [name] evaluation in the Emergency Department is complete. It has been determined that no acute conditions requiring further emergency intervention are present at this time. The patient/guardian have been advised of the diagnosis and plan. We have discussed signs and symptoms that warrant return to the ED, such as changes or worsening in symptoms, further seizures, AMS, fever.       Ernestina Patches, MD 04/11/15 1145

## 2015-04-08 NOTE — ED Notes (Signed)
Patient go himself dressed and was able to walk to wheelchair.

## 2015-04-08 NOTE — ED Notes (Signed)
Presents with seizure activity-per wife pt has been drinking all day and normally drinks a lot all day. He has been episodes of stiffening up and talking stating he is having a seizure. Denies pain.

## 2015-04-08 NOTE — ED Notes (Signed)
MD at bedside. 

## 2015-04-08 NOTE — ED Notes (Signed)
Attempted to ambulate patient, patient unable to stay awake to ambulate. MD made aware stated let patient sleep a little longer to become sober.

## 2015-04-09 ENCOUNTER — Telehealth: Payer: Self-pay | Admitting: *Deleted

## 2015-04-09 ENCOUNTER — Encounter: Payer: Self-pay | Admitting: *Deleted

## 2015-04-09 ENCOUNTER — Telehealth: Payer: Self-pay | Admitting: Surgery

## 2015-04-09 NOTE — Telephone Encounter (Signed)
F/U for referral of pt for [facility] appt requested by EDP following ED visit 04/08/15. LM for [name] with [facility] as pt has been seen in the past but the provider he has seen is no longer practicing. Will have to be seen as a new pt. Will return call if no word after lunch.

## 2015-04-09 NOTE — Telephone Encounter (Signed)
ED CM spoke with patient and wife regarding scheduling f/u with PCP at the [facility].  Appt. Scheduled 7/8 at 9am with Dr. Adrian Blackwater. Patient and wife made aware if they are unable to keep appointment to call at least 24 hours ahead of time. Clinic information provided. Patient and wife both verbalized understanding teach back done. No further ED CM needs identified.

## 2015-04-12 ENCOUNTER — Encounter: Payer: Self-pay | Admitting: Family Medicine

## 2015-04-12 ENCOUNTER — Ambulatory Visit: Payer: Medicaid Other | Attending: Family Medicine | Admitting: Family Medicine

## 2015-04-12 VITALS — BP 130/88 | HR 59 | Temp 97.9°F | Resp 16 | Ht 63.0 in | Wt 158.0 lb

## 2015-04-12 DIAGNOSIS — H5509 Other forms of nystagmus: Secondary | ICD-10-CM

## 2015-04-12 DIAGNOSIS — D171 Benign lipomatous neoplasm of skin and subcutaneous tissue of trunk: Secondary | ICD-10-CM | POA: Insufficient documentation

## 2015-04-12 DIAGNOSIS — E559 Vitamin D deficiency, unspecified: Secondary | ICD-10-CM

## 2015-04-12 DIAGNOSIS — K219 Gastro-esophageal reflux disease without esophagitis: Secondary | ICD-10-CM | POA: Diagnosis not present

## 2015-04-12 DIAGNOSIS — Z114 Encounter for screening for human immunodeficiency virus [HIV]: Secondary | ICD-10-CM | POA: Diagnosis not present

## 2015-04-12 DIAGNOSIS — J3489 Other specified disorders of nose and nasal sinuses: Secondary | ICD-10-CM | POA: Insufficient documentation

## 2015-04-12 DIAGNOSIS — F1010 Alcohol abuse, uncomplicated: Secondary | ICD-10-CM

## 2015-04-12 DIAGNOSIS — R220 Localized swelling, mass and lump, head: Secondary | ICD-10-CM

## 2015-04-12 DIAGNOSIS — R569 Unspecified convulsions: Secondary | ICD-10-CM

## 2015-04-12 DIAGNOSIS — R519 Headache, unspecified: Secondary | ICD-10-CM

## 2015-04-12 DIAGNOSIS — R51 Headache: Secondary | ICD-10-CM

## 2015-04-12 LAB — LIPID PANEL
Cholesterol: 161 mg/dL (ref 0–200)
HDL: 79 mg/dL (ref >=40)
LDL CALC: 63 mg/dL (ref 0–99)
Total CHOL/HDL Ratio: 2 Ratio
Triglycerides: 97 mg/dL (ref <150)
VLDL: 19 mg/dL (ref 0–40)

## 2015-04-12 LAB — COMPLETE METABOLIC PANEL WITH GFR
ALBUMIN: 4.1 g/dL (ref 3.5–5.2)
ALT: 19 U/L (ref 0–53)
AST: 21 U/L (ref 0–37)
Alkaline Phosphatase: 75 U/L (ref 39–117)
BUN: 12 mg/dL (ref 6–23)
CO2: 26 mEq/L (ref 19–32)
CREATININE: 0.89 mg/dL (ref 0.50–1.35)
Calcium: 9.2 mg/dL (ref 8.4–10.5)
Chloride: 105 mEq/L (ref 96–112)
GFR, Est Non African American: >89 mL/min
Glucose, Bld: 97 mg/dL (ref 70–99)
Potassium: 4.5 mEq/L (ref 3.5–5.3)
Sodium: 140 mEq/L (ref 135–145)
Total Bilirubin: 0.4 mg/dL (ref 0.2–1.2)
Total Protein: 7 g/dL (ref 6.0–8.3)

## 2015-04-12 LAB — HIV ANTIBODY (ROUTINE TESTING W REFLEX): HIV: NONREACTIVE

## 2015-04-12 MED ORDER — ACETAMINOPHEN-CODEINE #3 300-30 MG PO TABS: 1.0000 | ORAL_TABLET | Freq: Three times a day (TID) | ORAL | Status: DC | PRN

## 2015-04-12 MED ORDER — RANITIDINE HCL 150 MG PO TABS: 150.0000 mg | ORAL_TABLET | Freq: Two times a day (BID) | ORAL | Status: DC

## 2015-04-12 MED ORDER — METHYLPREDNISOLONE 4 MG PO TBPK: ORAL_TABLET | ORAL | Status: DC

## 2015-04-12 MED ORDER — VITAMIN B-1 100 MG PO TABS: 100.0000 mg | ORAL_TABLET | Freq: Every day | ORAL | Status: DC

## 2015-04-12 MED ORDER — LEVETIRACETAM 500 MG PO TABS: 500.0000 mg | ORAL_TABLET | Freq: Two times a day (BID) | ORAL | Status: DC

## 2015-04-12 MED ORDER — FLUTICASONE PROPIONATE 50 MCG/ACT NA SUSP: 2.0000 | Freq: Every day | NASAL | Status: DC

## 2015-04-12 MED ORDER — FOLIC ACID 1 MG PO TABS: 1.0000 mg | ORAL_TABLET | Freq: Every day | ORAL | Status: DC

## 2015-04-12 NOTE — Assessment & Plan Note (Signed)
Headaches: from mass also likely for cutting down on alcohol Tylenol #3 for head pain

## 2015-04-12 NOTE — Assessment & Plan Note (Addendum)
Screening HIV ordered  Screening HIV negative  

## 2015-04-12 NOTE — Assessment & Plan Note (Addendum)
Seizures: since childhood. Now with seizure activity in setting of heavy alcohol use. Limit alcohol to 12-24 oz of beer per day or 4-6 oz of wine.  Depakote 500 mg twice daily  Neurology referral placed

## 2015-04-12 NOTE — Assessment & Plan Note (Signed)
Nasal mass: Polyp vs papilloma with pain and swelling Steroid pack to reduce inflammation once done with pack start back with flonase  ENT referral placed

## 2015-04-12 NOTE — Assessment & Plan Note (Signed)
Lipoma L side: benign can be removed by a general surgeon, low priority right now.

## 2015-04-12 NOTE — Progress Notes (Signed)
Subjective:    Patient ID: [name], male    DOB: Nov 11, 1960, 54 y.o.   MRN: [id number] CC: est care seizure-like activity, ETOH abuse  HPI 54 yo M presents to establish care and discuss the following:  1. Seizures: since childhood. Previously on phenobarbital. Heavy ETOH drinker daily. Most recent seizure was last weekend. Witness by his girlfriend. Lasted for 90 seconds. Tonic period followed by unconsciousness. Patient was sitting in a chair when it happened. No head trauma or LOC.   2. ETOH abuse: most of his life. Parents also ETOH abuse. Denies illicit drug use. Last heavy drinking was 4 days ago. Has a 1-2 beers daily since 04/08/15. Having some tremors. Has GERD. No emesis or abdominal pain.   3. L sinus mass: since 2011. Growing slowly. Has L sided headache and facial pressure and pain. Not taking flonase. Has pain when lying on L side.   No current outpatient prescriptions on file prior to visit.   No current facility-administered medications on file prior to visit.   Soc Hx: ETOH abuse (beer and wine) Med Hx: seizures since childhood Fam Hx: ETOH abuse in both parents   Review of Systems  Constitutional: Negative for fever, chills, fatigue and unexpected weight change.  HENT: Positive for sinus pressure.   Eyes: Negative for visual disturbance.  Respiratory: Negative for cough and shortness of breath.   Cardiovascular: Negative for chest pain, palpitations and leg swelling.  Gastrointestinal: Negative for nausea, vomiting, abdominal pain, diarrhea, constipation and blood in stool.  Musculoskeletal: Negative for myalgias, back pain, arthralgias, gait problem and neck pain.  Skin: Negative for rash.  Neurological: Positive for tremors, seizures, facial asymmetry and headaches. Negative for dizziness, syncope, weakness and light-headedness.  Psychiatric/Behavioral: Positive for sleep disturbance.       Objective:   Physical Exam BP 130/88 mmHg  Pulse 59   Temp(Src) 97.9 F (36.6 C) (Oral)  Resp 16  Ht 5\' 3"  (1.6 m)  Wt 158 lb (71.668 kg)  BMI 28.00 kg/m2  SpO2 98%  Wt Readings from Last 3 Encounters:  04/12/15 158 lb (71.668 kg)  08/13/14 171 lb 3.2 oz (77.656 kg)  08/10/14 164 lb 8 oz (74.617 kg)    BP Readings from Last 3 Encounters:  04/12/15 130/88  04/08/15 129/71  08/13/14 154/89  General appearance: alert, cooperative, no distress and affect slightly depressed  Head: Normocephalic, without obvious abnormality, sinuses tender to percussion L maxillary  Eyes: pupils small, EOMI, horizontal nystagmus on lateral gaze on both sides  Ears: normal TM's and external ear canals both ears Nose: normal R nares, L nares obstructed by polypoid mass tenderness along nares, mild swelling. No erythema  Throat: normal oropharynx, poor dentition  Neck: no adenopathy, no carotid bruit, no JVD, supple, symmetrical, trachea midline and thyroid not enlarged, symmetric, no tenderness/mass/nodules Lungs: clear to auscultation bilaterally Heart: regular rate and rhythm, S1, S2 normal, no murmur, click, rub or gallop Extremities: extremities normal, atraumatic, no cyanosis or edema Pulses: 2+ and symmetric  Neuro: alert, oriented, following commands, horizontal nystagmus as noted above, slight L sided facial droop MSK: non tender subcutaneous mass L flank 6 cm x 5 cm    CT head 04/08/2015: reviewed along with all CT head since 2007 IMPRESSION: 1. No acute findings. 2. 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 Postobstructive sinusitis is stable from 2015. No evidence of intracranial complication to explain the history. 3. Brain atrophy and  white matter disease is stable from 2015.      Assessment & Plan:

## 2015-04-12 NOTE — Progress Notes (Signed)
Establish Care Hx Sz no taking medication. Stated had Sz last week lasting about a min  Tumor on nasal cavity  Hx tobacco - 1 1/2 PPD

## 2015-04-12 NOTE — Patient Instructions (Addendum)
[name],  Thank you for coming in today. It was a pleasure meeting you. I look forward to being your primary doctor.   1. Nasal mass: Polyp vs papilloma with pain and swelling Steroid pack to reduce inflammation once done with pack start back with flonase  ENT referral placed  2. Headaches: from mass also likely for cutting down on alcohol Tylenol #3 for head pain   3. Seizures: since childhood. Now with seizure activity in setting of heavy alcohol use. Limit alcohol to 12-24 oz of beer per day or 4-6 oz of wine.  Depakote 500 mg twice daily  Neurology referral placed   4. Lipoma L side: benign can be removed by a general surgeon, low priority right now.   F/u in 3 weeks for f/u seizures and nasal polyp   Dr. Adrian Blackwater

## 2015-04-12 NOTE — Care Management Note (Signed)
Late Entry for Case Management 04/09/2015. Attempted to make appointment for this pt at [facility] where he had been seen in the past as he had been experiencing "seizures" according to wife. When I spoke with reception area they informed me that the provide he had seen in the past was no longer practicing there and he would be a new pt. They had NO appointments for New pts at this time. Referred him to Rosilyn Mings with [facility] who then made contact with wife to start process for [employer] and referral for Neurologist and ENT in [place]. Will continue to follow.

## 2015-04-13 LAB — VITAMIN D 25 HYDROXY (VIT D DEFICIENCY, FRACTURES): Vit D, 25-Hydroxy: 13 ng/mL — ABNORMAL LOW (ref 30–100)

## 2015-04-15 DIAGNOSIS — E559 Vitamin D deficiency, unspecified: Secondary | ICD-10-CM | POA: Insufficient documentation

## 2015-04-15 MED ORDER — VITAMIN D (ERGOCALCIFEROL) 1.25 MG (50000 UNIT) PO CAPS: 50000.0000 [IU] | ORAL_CAPSULE | ORAL | Status: DC

## 2015-04-15 NOTE — Assessment & Plan Note (Signed)
Vit D def Will treat

## 2015-04-15 NOTE — Addendum Note (Signed)
Addended by: [name] on: 04/15/2015 09:40 AM   Modules accepted: Orders

## 2015-04-16 ENCOUNTER — Telehealth: Payer: Self-pay | Admitting: *Deleted

## 2015-04-16 NOTE — Telephone Encounter (Signed)
-----   Message from [name], MD sent at 04/15/2015  9:38 AM EDT ----- Vit D deficiency All other labs normal Screening HIV negative

## 2015-04-22 ENCOUNTER — Ambulatory Visit (HOSPITAL_COMMUNITY): Admission: RE | Admit: 2015-04-22 | Payer: Self-pay | Source: Ambulatory Visit

## 2015-05-21 ENCOUNTER — Ambulatory Visit (HOSPITAL_COMMUNITY): Admission: RE | Admit: 2015-05-21 | Payer: Medicaid Other | Source: Ambulatory Visit

## 2015-05-29 ENCOUNTER — Ambulatory Visit: Payer: Self-pay | Admitting: Neurology

## 2015-05-31 ENCOUNTER — Ambulatory Visit (HOSPITAL_COMMUNITY): Admission: RE | Admit: 2015-05-31 | Payer: Medicaid Other | Source: Ambulatory Visit

## 2015-06-14 ENCOUNTER — Ambulatory Visit (HOSPITAL_COMMUNITY): Admission: RE | Admit: 2015-06-14 | Payer: Medicaid Other | Source: Ambulatory Visit

## 2015-06-28 ENCOUNTER — Ambulatory Visit (HOSPITAL_COMMUNITY): Admission: RE | Admit: 2015-06-28 | Payer: Medicaid Other | Source: Ambulatory Visit

## 2015-07-09 ENCOUNTER — Ambulatory Visit (HOSPITAL_COMMUNITY): Admission: RE | Admit: 2015-07-09 | Payer: Medicaid Other | Source: Ambulatory Visit

## 2015-07-17 ENCOUNTER — Ambulatory Visit (HOSPITAL_COMMUNITY): Admission: RE | Admit: 2015-07-17 | Payer: Medicaid Other | Source: Ambulatory Visit

## 2015-07-31 ENCOUNTER — Ambulatory Visit (HOSPITAL_COMMUNITY)
Admission: RE | Admit: 2015-07-31 | Discharge: 2015-07-31 | Disposition: A | Discharge status: Home / Self Care | Payer: Medicaid Other | Source: Ambulatory Visit | Attending: Family Medicine | Admitting: Family Medicine

## 2015-07-31 DIAGNOSIS — G3189 Other specified degenerative diseases of nervous system: Secondary | ICD-10-CM | POA: Diagnosis not present

## 2015-07-31 DIAGNOSIS — Z8673 Personal history of transient ischemic attack (TIA), and cerebral infarction without residual deficits: Secondary | ICD-10-CM | POA: Insufficient documentation

## 2015-07-31 DIAGNOSIS — R519 Headache, unspecified: Secondary | ICD-10-CM

## 2015-07-31 DIAGNOSIS — H5509 Other forms of nystagmus: Secondary | ICD-10-CM | POA: Diagnosis not present

## 2015-07-31 DIAGNOSIS — R938 Abnormal findings on diagnostic imaging of other specified body structures: Secondary | ICD-10-CM | POA: Insufficient documentation

## 2015-07-31 DIAGNOSIS — R569 Unspecified convulsions: Secondary | ICD-10-CM

## 2015-07-31 DIAGNOSIS — R51 Headache: Secondary | ICD-10-CM | POA: Diagnosis not present

## 2015-07-31 MED ORDER — GADOBENATE DIMEGLUMINE 529 MG/ML IV SOLN
15.0000 mL | Freq: Once | INTRAVENOUS | Status: AC | PRN
  Administered 2015-07-31: 15 mL via INTRAVENOUS

## 2015-10-18 ENCOUNTER — Encounter (HOSPITAL_COMMUNITY): Payer: Self-pay | Admitting: Emergency Medicine

## 2015-10-18 ENCOUNTER — Emergency Department (HOSPITAL_COMMUNITY)
Admission: EM | Admit: 2015-10-18 | Discharge: 2015-10-19 | Disposition: A | Discharge status: Home / Self Care | Payer: Medicaid Other | Attending: Emergency Medicine | Admitting: Emergency Medicine

## 2015-10-18 DIAGNOSIS — Z7951 Long term (current) use of inhaled steroids: Secondary | ICD-10-CM | POA: Diagnosis not present

## 2015-10-18 DIAGNOSIS — M79602 Pain in left arm: Secondary | ICD-10-CM | POA: Insufficient documentation

## 2015-10-18 DIAGNOSIS — F1010 Alcohol abuse, uncomplicated: Secondary | ICD-10-CM

## 2015-10-18 DIAGNOSIS — R0689 Other abnormalities of breathing: Secondary | ICD-10-CM | POA: Insufficient documentation

## 2015-10-18 DIAGNOSIS — F17200 Nicotine dependence, unspecified, uncomplicated: Secondary | ICD-10-CM | POA: Diagnosis not present

## 2015-10-18 DIAGNOSIS — Z8701 Personal history of pneumonia (recurrent): Secondary | ICD-10-CM | POA: Insufficient documentation

## 2015-10-18 DIAGNOSIS — F10121 Alcohol abuse with intoxication delirium: Secondary | ICD-10-CM | POA: Diagnosis not present

## 2015-10-18 DIAGNOSIS — Z7952 Long term (current) use of systemic steroids: Secondary | ICD-10-CM | POA: Insufficient documentation

## 2015-10-18 DIAGNOSIS — R6889 Other general symptoms and signs: Secondary | ICD-10-CM

## 2015-10-18 DIAGNOSIS — F10921 Alcohol use, unspecified with intoxication delirium: Secondary | ICD-10-CM

## 2015-10-18 DIAGNOSIS — IMO0001 Reserved for inherently not codable concepts without codable children: Secondary | ICD-10-CM

## 2015-10-18 DIAGNOSIS — Z79899 Other long term (current) drug therapy: Secondary | ICD-10-CM | POA: Diagnosis not present

## 2015-10-18 DIAGNOSIS — M79601 Pain in right arm: Secondary | ICD-10-CM | POA: Insufficient documentation

## 2015-10-18 DIAGNOSIS — R569 Unspecified convulsions: Secondary | ICD-10-CM | POA: Diagnosis present

## 2015-10-18 LAB — CBC WITH DIFFERENTIAL/PLATELET
BASOS ABS: 0 10*3/uL (ref 0.0–0.1)
BASOS PCT: 0 %
Eosinophils Absolute: 0.2 10*3/uL (ref 0.0–0.7)
Eosinophils Relative: 3 %
HEMATOCRIT: 42.3 % (ref 39.0–52.0)
HEMOGLOBIN: 14.3 g/dL (ref 13.0–17.0)
Lymphocytes Relative: 30 %
Lymphs Abs: 2.3 10*3/uL (ref 0.7–4.0)
MCH: 31 pg (ref 26.0–34.0)
MCHC: 33.8 g/dL (ref 30.0–36.0)
MCV: 91.6 fL (ref 78.0–100.0)
Monocytes Absolute: 0.4 10*3/uL (ref 0.1–1.0)
Monocytes Relative: 5 %
NEUTROS ABS: 4.7 10*3/uL (ref 1.7–7.7)
Neutrophils Relative %: 62 %
Platelets: 122 10*3/uL — ABNORMAL LOW (ref 150–400)
RBC: 4.62 MIL/uL (ref 4.22–5.81)
RDW: 13.9 % (ref 11.5–15.5)
WBC: 7.6 10*3/uL (ref 4.0–10.5)

## 2015-10-18 LAB — URINALYSIS, ROUTINE W REFLEX MICROSCOPIC
BILIRUBIN URINE: NEGATIVE
GLUCOSE, UA: NEGATIVE mg/dL
Hgb urine dipstick: NEGATIVE
KETONES UR: NEGATIVE mg/dL
Leukocytes, UA: NEGATIVE
NITRITE: NEGATIVE
PH: 5.5 (ref 5.0–8.0)
Protein, ur: NEGATIVE mg/dL
Specific Gravity, Urine: 1.003 — ABNORMAL LOW (ref 1.005–1.030)

## 2015-10-18 LAB — BASIC METABOLIC PANEL
ANION GAP: 12 (ref 5–15)
BUN: 5 mg/dL — ABNORMAL LOW (ref 6–20)
CHLORIDE: 111 mmol/L (ref 101–111)
CO2: 20 mmol/L — ABNORMAL LOW (ref 22–32)
Calcium: 8.8 mg/dL — ABNORMAL LOW (ref 8.9–10.3)
Creatinine, Ser: 0.78 mg/dL (ref 0.61–1.24)
GLUCOSE: 101 mg/dL — AB (ref 65–99)
Potassium: 4 mmol/L (ref 3.5–5.1)
Sodium: 143 mmol/L (ref 135–145)

## 2015-10-18 LAB — RAPID URINE DRUG SCREEN, HOSP PERFORMED
AMPHETAMINES: NOT DETECTED
BARBITURATES: NOT DETECTED
Benzodiazepines: NOT DETECTED
COCAINE: NOT DETECTED
OPIATES: NOT DETECTED
TETRAHYDROCANNABINOL: NOT DETECTED

## 2015-10-18 LAB — CBG MONITORING, ED: Glucose-Capillary: 94 mg/dL (ref 65–99)

## 2015-10-18 MED ORDER — LORAZEPAM 2 MG/ML IJ SOLN
1.0000 mg | Freq: Once | INTRAMUSCULAR | Status: AC
  Administered 2015-10-18: 1 mg via INTRAVENOUS
  Filled 2015-10-18: qty 1

## 2015-10-18 MED ORDER — NALOXONE HCL 0.4 MG/ML IJ SOLN: 0.4000 mg | Freq: Once | INTRAMUSCULAR | Status: DC

## 2015-10-18 NOTE — ED Notes (Addendum)
Pt brought from home to ED by [facility] for 3 episodes of seizures that last at least 1 min each, getting very confuse and combative when seizures stop. pt is been drinking ETOH all day today. CBG 98, BP 150/80, HR 90. C/o bilateral arm pain states he had DVT on the past and feels like it.

## 2015-10-18 NOTE — ED Notes (Signed)
Pt placed on 2L after pt O2 sat dropped in the low 80's while pt asleep. When pt awakened pt O2 sat goes back to 100.

## 2015-10-18 NOTE — ED Notes (Signed)
If pt is dc home friend to be called to pick him up [phone].

## 2015-10-18 NOTE — ED Provider Notes (Signed)
CSN: [id number]     Arrival date & time 10/18/15  2038 History   First MD Initiated Contact with Patient 10/18/15 2045     Chief Complaint  Patient presents with  . Seizures     (Consider location/radiation/quality/duration/timing/severity/associated sxs/prior Treatment) HPI   68 y M w PMH seizures on keppra, etoh abuse, coming in with etoh abuse and concern for possible seizure.  Patient admits to drinking at least 40 oz of beer today.  He began having some bilateral arm pain later in the day which he is unable to describe further 2/2 his intoxication.  No falls, no chest pain, no sob.  Ems was called out and reportedly had 3 seizures during transport to the hospital.  The seizures were all short (approx 10 seconds) and during which he had bilateral upper extremity increased tone and gaze deviation.  No clonic movements.  He had no postictal periods.  He denies chest pain/sob or other sx and is now back to baseline.  Past Medical History  Diagnosis Date  . Seizures ([place])   . Pneumonia   . Alcohol abuse    Past Surgical History  Procedure Laterality Date  . Brain tumor excision     Family History  Problem Relation Age of Onset  . Alcoholism Mother   . Alcoholism Father    Social History  Substance Use Topics  . Smoking status: Current Some Day Smoker  . Smokeless tobacco: None  . Alcohol Use: 0.6 oz/week    1 Cans of beer per week    Review of Systems  Constitutional: Negative for fever and chills.  Eyes: Negative for redness.  Respiratory: Negative for cough and shortness of breath.   Cardiovascular: Negative for chest pain.  Gastrointestinal: Negative for nausea, vomiting, abdominal pain and diarrhea.  Genitourinary: Negative for dysuria.  Skin: Negative for rash.  Neurological: Negative for headaches.  All other systems reviewed and are negative.     Allergies  Borax  Home Medications   Prior to Admission medications   Medication Sig Start Date End Date  Taking? Authorizing Provider  acetaminophen-codeine (TYLENOL #3) 300-30 MG per tablet Take 1 tablet by mouth every 8 (eight) hours as needed for moderate pain. 04/12/15   Josalyn Funches, MD  fluticasone (FLONASE) 50 MCG/ACT nasal spray Place 2 sprays into both nostrils daily. 04/12/15   [name], MD  folic acid (FOLVITE) 1 MG tablet Take 1 tablet (1 mg total) by mouth daily. 04/12/15   Josalyn Funches, MD  levETIRAcetam (KEPPRA) 500 MG tablet Take 1 tablet (500 mg total) by mouth 2 (two) times daily. 04/12/15   Josalyn Funches, MD  methylPREDNISolone (MEDROL DOSEPAK) 4 MG TBPK tablet Per packet insert 04/12/15   Josalyn Funches, MD  ranitidine (ZANTAC) 150 MG tablet Take 1 tablet (150 mg total) by mouth 2 (two) times daily. 04/12/15   Josalyn Funches, MD  thiamine (VITAMIN B-1) 100 MG tablet Take 1 tablet (100 mg total) by mouth daily. 04/12/15   Josalyn Funches, MD  Vitamin D, Ergocalciferol, (DRISDOL) 50000 UNITS CAPS capsule Take 1 capsule (50,000 Units total) by mouth every 7 (seven) days. For 12 weeks 04/15/15   [name], MD   BP 127/83 mmHg  Pulse 73  Temp(Src) 98.1 F (36.7 C) (Oral)  Resp 16  Ht 5\' 4"  (1.626 m)  Wt 71.668 kg  BMI 27.11 kg/m2  SpO2 96% Physical Exam  Constitutional: He is oriented to person, place, and time. No distress.  HENT:  Head: Normocephalic  and atraumatic.  Eyes: EOM are normal. Pupils are equal, round, and reactive to light.  Neck: Normal range of motion. Neck supple.  Cardiovascular: Normal rate.   Pulmonary/Chest: Effort normal. No respiratory distress.  Abdominal: Soft. There is no tenderness.  Musculoskeletal: Normal range of motion.  No obvious deformity or swelling of the bilateral upper extremities.  He has strong bilateral radial pulses.  Neurological: He is alert and oriented to person, place, and time. He has normal strength. No cranial nerve deficit or sensory deficit.  Intact motor function in all four extremities Not following commands  well. Slurred speech  Skin: No rash noted. He is not diaphoretic.  Psychiatric: He has a normal mood and affect.    ED Course  Procedures (including critical care time) Labs Review Labs Reviewed  CBC WITH DIFFERENTIAL/PLATELET - Abnormal; Notable for the following:    Platelets 122 (*)    All other components within normal limits  BASIC METABOLIC PANEL - Abnormal; Notable for the following:    CO2 20 (*)    Glucose, Bld 101 (*)    BUN 5 (*)    Calcium 8.8 (*)    All other components within normal limits  ETHANOL - Abnormal; Notable for the following:    Alcohol, Ethyl (B) 299 (*)    All other components within normal limits  URINALYSIS, ROUTINE W REFLEX MICROSCOPIC (NOT AT [facility]) - Abnormal; Notable for the following:    Color, Urine STRAW (*)    Specific Gravity, Urine 1.003 (*)    All other components within normal limits  URINE RAPID DRUG SCREEN, HOSP PERFORMED  CBG MONITORING, ED    Imaging Review No results found. I have personally reviewed and evaluated these images and lab results as part of my medical decision-making.   EKG Interpretation None      MDM   Final diagnoses:  ETOH abuse  Spells ([place])  Alcohol intoxication, with delirium ([place])    79 y M w PMH seizures on keppra, etoh abuse, coming in with etoh abuse and concern for possible seizure.  Patient admits to drinking at least 40 oz of beer today.  He began having some bilateral arm pain later in the day.  EMS was called and he reportedly had three spells of increased tone in his bilateral upper extremities with gaze deviation.   Patient notes that he was recently divorced but that he now has a girlfriend.  He does admit that he is sad about his divorce but he denies any SI/HI/hallucinations.  Exam as above, unremarkable.  No evidence of trauma to bilateral upper extremities.  No evidence of arterial occlusion in the bilateral upper extremities or arm swelling suggestive of dvt. His neurologic spells do  not seem to be seizures given how brief they are and no post-ictal period.  Also, he has had a couple of these spells in the ED. During the spells he was unreponsive to verbal stimuli but he was responsive to painful stimuli and would localize pain during them.. These spells seem non-epileptic.  Will obtain labs including cbc/bmp/ethanol/uds to eval for metabolic causes of his sx.  Patient is on keppra but no AED's that a level would be helpful. Labs with elevated etoh.  Will continue to observe.  uds neg, cbc/bmp unremarkable.  He has no headache, no neck stiffness, doubt meningitis, doubt ICH.  Will hold off on ct head for now and continue to observe.  On reassessment, patient continues to be very sleepy but is  still responsive to painful stimuli, localizing painful stimuli and is protecting his airway.   Care of patient transferred to Dr. Leonides Schanz at 1:30 am.  Plan to continue to metabolize etoh and observe.    Jarome Matin, MD 10/19/15 [id number]  Gareth Morgan, MD 10/19/15 1009

## 2015-10-19 LAB — ETHANOL: ALCOHOL ETHYL (B): 299 mg/dL — AB (ref <5)

## 2015-10-19 NOTE — ED Notes (Signed)
Pt given coffee and gram crackers.

## 2015-10-19 NOTE — ED Notes (Addendum)
This RN saw Pt fully dressed and walking down the hall. This RN redirected pt to room to take pt's IV out. Notified MD. MD said that pt cannot leave facility due to ETOH level, unless pt has a sober driver to take him home. Pt is resting in bed.

## 2015-10-19 NOTE — ED Provider Notes (Signed)
7:25 AM  Pt here for alcohol intoxication. He is now clinically sober, walking without difficulty, drinking without difficulty. I feel he is safe to be discharged from the emergency department. Hemodynamically stable. Patient has not had any seizure-like activity in several hours. It was thought that his seizure-like activity was psychogenic as he was conscious, talking during these episodes. He has no current psychiatric safety concerns. Discussed return precautions. He verbalizes understanding and is comfortable with this plan.  [place], DO 10/19/15 1000

## 2015-10-19 NOTE — Discharge Instructions (Signed)
Alcohol Intoxication °Alcohol intoxication occurs when the amount of alcohol that a person has consumed impairs his or her ability to mentally and physically function. Alcohol directly impairs the normal chemical activity of the brain. Drinking large amounts of alcohol can lead to changes in mental function and behavior, and it can cause many physical effects that can be harmful.  °Alcohol intoxication can range in severity from mild to very severe. Various factors can affect the level of intoxication that occurs, such as the person's age, gender, weight, frequency of alcohol consumption, and the presence of other medical conditions (such as diabetes, seizures, or heart conditions). Dangerous levels of alcohol intoxication may occur when people drink large amounts of alcohol in a short period (binge drinking). Alcohol can also be especially dangerous when combined with certain prescription medicines or "recreational" drugs. °SIGNS AND SYMPTOMS °Some common signs and symptoms of mild alcohol intoxication include: °· Loss of coordination. °· Changes in mood and behavior. °· Impaired judgment. °· Slurred speech. °As alcohol intoxication progresses to more severe levels, other signs and symptoms will appear. These may include: °· Vomiting. °· Confusion and impaired memory. °· Slowed breathing. °· Seizures. °· Loss of consciousness. °DIAGNOSIS  °Your health care provider will take a medical history and perform a physical exam. You will be asked about the amount and type of alcohol you have consumed. Blood tests will be done to measure the concentration of alcohol in your blood. In many places, your blood alcohol level must be lower than 80 mg/dL (0.08%) to legally drive. However, many dangerous effects of alcohol can occur at much lower levels.  °TREATMENT  °People with alcohol intoxication often do not require treatment. Most of the effects of alcohol intoxication are temporary, and they go away as the alcohol naturally  leaves the body. Your health care provider will monitor your condition until you are stable enough to go home. Fluids are sometimes given through an IV access tube to help prevent dehydration.  °HOME CARE INSTRUCTIONS °· Do not drive after drinking alcohol. °· Stay hydrated. Drink enough water and fluids to keep your urine clear or pale yellow. Avoid caffeine.   °· Only take over-the-counter or prescription medicines as directed by your health care provider.   °SEEK MEDICAL CARE IF:  °· You have persistent vomiting.   °· You do not feel better after a few days. °· You have frequent alcohol intoxication. Your health care provider can help determine if you should see a substance use treatment counselor. °SEEK IMMEDIATE MEDICAL CARE IF:  °· You become shaky or tremble when you try to stop drinking.   °· You shake uncontrollably (seizure).   °· You throw up (vomit) blood. This may be bright red or may look like black coffee grounds.   °· You have blood in your stool. This may be bright red or may appear as a black, tarry, bad smelling stool.   °· You become lightheaded or faint.   °MAKE SURE YOU:  °· Understand these instructions. °· Will watch your condition. °· Will get help right away if you are not doing well or get worse. °  °This information is not intended to replace advice given to you by your health care provider. Make sure you discuss any questions you have with your health care provider. °  °Document Released: 07/01/2005 Document Revised: 05/24/2013 Document Reviewed: 02/24/2013 °Elsevier Interactive Patient Education ©2016 [employer] Inc[employer] ° ° °Emergency Department Resource Guide °1) Find a Doctor and Pay Out of Pocket °Although you won't have to find out   who is covered by your insurance plan, it is a good idea to ask around and get recommendations. You will then need to call the office and see if the doctor you have chosen will accept you as a new patient and what types of options they offer for patients who  are self-pay. Some doctors offer discounts or will set up payment plans for their patients who do not have insurance, but you will need to ask so you aren't surprised when you get to your appointment. ° °2) Contact Your Local Health Department °Not all health departments have doctors that can see patients for sick visits, but many do, so it is worth a call to see if yours does. If you don't know where your local health department is, you can check in your phone book. The CDC also has a tool to help you locate your state's health department, and many state websites also have listings of all of their local health departments. ° °3) Find a Walk-in Clinic °If your illness is not likely to be very severe or complicated, you may want to try a walk in clinic. These are popping up all over the country in pharmacies, drugstores, and shopping centers. They're usually staffed by nurse practitioners or physician assistants that have been trained to treat common illnesses and complaints. They're usually fairly quick and inexpensive. However, if you have serious medical issues or chronic medical problems, these are probably not your best option. ° °No Primary Care Doctor: °- Call [facility] at  832-8000 - they can help you locate a primary care doctor that  accepts your insurance, provides certain services, etc. °- Physician Referral Service- 1-800-533-3463 ° °Chronic Pain Problems: °Organization         Address  Phone   Notes  °[facility]  (336) 297-2271 Patients need to be referred by their primary care doctor.  ° °Medication Assistance: °Organization         Address  Phone   Notes  °[place] [facility] [address] °[place], [place] [address] (336) 641-8030 --Must be a resident of [place] °-- Must have NO insurance coverage whatsoever (no Medicaid/ Medicare, etc.) °-- The pt. MUST have a primary care doctor that directs their care regularly and follows  them in the community °  °[facility]  (866) 331-1348   °[employer]  (888) 892-1162   ° °Agencies that provide inexpensive medical care: °Organization         Address  Phone   Notes  °[facility]  (336) 832-8035   °[facility]    (336) 832-7272   °[facility] [address] °[place],  [address] (336) 832-4777   °[facility] [address], °[place] (336) 271-4999   °[facility]    (336) 373-0678   °[facility]    (336) 272-1050   °[facility] ° [address], [place] Phone:  (336) 832-4444, Fax:  (336) 832-4440 Hours of Operation:  9 am - 6 pm, M-F.  Also accepts Medicaid/Medicare and self-pay.  °[facility] ° [address], [place] Phone: (336) [phone], Fax: (336) [phone]. Hours of Operation:  8:30 am - 5:30 pm, M-F.  Also accepts Medicaid and self-pay.  °[facility] [place] [address], [place] Phone: (336) 878-6027   °[facility] [address], [place],  (  336)723-1848, Ext. 123 Mondays & Thursdays: 7-9 AM.  First 15 patients are seen on a first come, first serve basis. °  ° °Medicaid-accepting [place] Providers: ° °Organization         Address  Phone   Notes  °[facility] [address], [place] (336) 641-2100 Also accepts self-pay patients.  °[facility] [address], [place] ° (336) 856-9996   °[facility] [address], [place] (336) 288-8857   °[facility] [address] [place] [address], [place] (336) 299-7000   °Veita Bland [address], [place]  ° (336) 373-1557 Only accepts [place] Access Medicaid patients after they have their name applied to their card.  ° °Self-Pay (no insurance) in [place]: ° °Organization         Address  Phone   Notes  °Sickle Cell  Patients, [facility] [address], [place] (336) 832-1970   °[facility] [address], [place] (336) 832-4400   °[facility] ° [address], [place] (336) 992-4800   °[facility]/Dr. Osei-Bonsu ° [address] [place] [address], [place] or [address], [place] (336) [phone] Phone number for both [place] and [place] locations is the same.  °[facility] [address], [place] (336) 299-0000   °[facility] [address] [place] [address], [place] or [address] (336) 852-7530 °(336) 878-2260   °[facility] [address], [place] (336) 350-1642, phone; (336) 294-5005, fax Sees patients 1st and 3rd Saturday of every month.  Must not qualify for public or private insurance (i.e. Medicaid, Medicare, [place] Health Choice, Veterans' Benefits) • Household income should be no more than 200% of the poverty level •The clinic cannot treat you if you are pregnant or think you are pregnant • Sexually transmitted diseases are not treated at the clinic.  ° ° °Dental Care: °Organization         Address  Phone  Notes  °[facility] [address], [place] (336) 641-6152 Accepts children up to age 21 who are enrolled in Medicaid or [place] Health Choice; pregnant women with a Medicaid card; and children who have applied for Medicaid or [place] Health Choice, but were declined, whose parents can pay a reduced fee at time of service.  °[facility] [place]  [address], [place] (336) 641-7733 Accepts children up to age 21 who are enrolled in Medicaid or [place] Health Choice; pregnant women with a Medicaid card; and children who have applied for Medicaid or [place] Health Choice, but were declined, whose parents can pay a reduced fee at time of service.  °[facility] ° [address],  (336) 641-4533 Patients are seen by appointment only. Walk-ins are not accepted. [facility] will see patients 18 years of age and older. °Monday - Tuesday (8am-5pm) °Most Wednesdays (8:30-5pm) °$30 per visit, cash only  °[facility] ° [address], [place] (336) 641-4533 Patients are seen by appointment only. Walk-ins are not accepted. [facility] will see patients 18 years of age and older. °One Wednesday Evening (Monthly: Volunteer Based).  $30 per visit, cash only  °[facility]  (919)   537-3737 for adults; Children under age 4, call Graduate Pediatric Dentistry at ([phone]) [phone]. Children aged 4-14, please call ([phone]) [phone] to request a pediatric application. ° Dental services are provided in all areas of dental care including fillings, crowns and bridges, complete and partial dentures, implants, gum treatment, root canals, and extractions. Preventive care is also provided. Treatment is provided to both adults and children. °Patients are selected via a lottery and there is often a waiting list. °  °[facility] [address], °[place] ° (336) 763-8833 [email or web address] °  °[facility] [address], [place], [place] (336)723-1848, Ext. 123 Second and Fourth Thursday of each month, opens at 6:30 AM; Clinic ends at 9 AM.  Patients are seen on a first-come first-served basis, and a limited number are seen during each clinic.  ° °[facility] ° [address], [place], [place] (336) 723-7904   Eligibility Requirements °You must have lived in [place], [place], or [place] counties for at least the last three months. °  You cannot be eligible for state or federal sponsored healthcare insurance, including Veterans Administration, Medicaid, or Medicare. °  You generally cannot be eligible for healthcare insurance through your employer.  °  How to apply: °Eligibility  screenings are held every Tuesday and Wednesday afternoon from 1:00 pm until 4:00 pm. You do not need an appointment for the interview!  °[facility] [address], [place], [place] 336-631-2330   °[facility]  336-342-8273   °[facility]  336-703-3100   °[facility]  336-570-6415   ° °Behavioral Health Resources in the Community: °Intensive Outpatient Programs °Organization         Address  Phone  Notes  °[facility] [address], [place], [place] 336-878-6098   °[facility] [address], [place], [place] 336-832-9800   °[facility] [address], [place], [place] ° 336-882-2125   °[facility] [address],  °[place], [place] 1-800-853-5163 or 336-641-4981   °Substance Abuse Resources °Organization         Address  Phone  Notes  °[facility]  336-882-2125   °[facility]  336-784-9470   °[facility]  336-285-9073   °[facility]  336-845-3988   °Residential & Outpatient Substance Abuse Program  1-800-659-3381   °Psychological Services °Organization         Address  Phone  Notes  °[facility]  336- 832-9600   °[facility]  336- 378-7881   °[facility] [address], [place] 1-800-853-5163 or 336-641-4981   ° °Mobile Crisis Teams °Organization         Address  Phone  Notes  °[facility], Mobile Crisis Care Unit  1-877-626-1772   °Assertive °[facility] ° [address] [place], [place] 336-834-9664   °Sharon DeEsch [address] °[place]  336-554-5454   ° °Self-Help/Support Groups °Organization         Address  Phone             Notes  °[facility] - variety of support groups  336- 373-1402 Call for more information  °Narcotics Anonymous (NA), [facility] [address], °[place]   2 meetings at  this location  ° °Residential Treatment Programs °Organization         Address  Phone  Notes  °  [facility] [address],    °[place]  1-866-801-8205   °[facility] ° [address], [place], [place] 704-293-8524   °[facility] [address], [place] 336-845-3988 Admissions: 8am-3pm M-F  °[facility] [address]    °[place], [place] 336-841-1104   °[facility] [address], [place], [place] 336-379-7146   °[facility] [address]  °[place], [place] 336-285-9073   °[facility] - Intensive Outpatient [address], [place], [place] 336-852-3033   °[facility] [address]  °[place], [place] 1-877-615-2722 or 336-784-9470   °[facility] [address] [place], [place] 336-227-7417 Accepts Medicaid  °[facility] [address]  °[place] 1-800-659-3381 Substance Abuse/Addiction Treatment  ° °[place] Behavioral Health Resources °Organization         Address  Phone  Notes  °[facility]  (888) 581-9988   °Julie Brannon, PhD [address] [place], [place]   (336) 349-5553 or (336) 951-0000   °[facility]   [address] °[place], [place] (336) 349-4454   °[facility] [address], [place], [place] (336) 342-8316 Insurance/Medicaid/sponsorship through [facility]  °[facility] [address]                                    [place], [place] (336) 342-8316 Therapy/tele-psych/case  °[facility] [address]  ° [place], [place] (336) 349-2233    °Dr. Arfeen  (336) 349-4544   °[facility] [place]  [employer] [place] [facility] 1) [address], [place] °2) [address], [place] °3)  [address], [place] (336) 349-3220 °(336) 342-7768 ° °(336) 342-8140   °[place] [facility] (336) 342-1394 or (336)  342-3537 (After Hours)    ° ° ° °

## 2015-10-21 ENCOUNTER — Emergency Department (HOSPITAL_COMMUNITY)
Admission: EM | Admit: 2015-10-21 | Discharge: 2015-10-22 | Disposition: A | Discharge status: Home / Self Care | Payer: Medicaid Other | Attending: Emergency Medicine | Admitting: Emergency Medicine

## 2015-10-21 ENCOUNTER — Encounter (HOSPITAL_COMMUNITY): Payer: Self-pay

## 2015-10-21 DIAGNOSIS — Z8701 Personal history of pneumonia (recurrent): Secondary | ICD-10-CM | POA: Insufficient documentation

## 2015-10-21 DIAGNOSIS — F17200 Nicotine dependence, unspecified, uncomplicated: Secondary | ICD-10-CM | POA: Diagnosis not present

## 2015-10-21 DIAGNOSIS — R569 Unspecified convulsions: Secondary | ICD-10-CM

## 2015-10-21 DIAGNOSIS — Z79899 Other long term (current) drug therapy: Secondary | ICD-10-CM | POA: Insufficient documentation

## 2015-10-21 DIAGNOSIS — Z7951 Long term (current) use of inhaled steroids: Secondary | ICD-10-CM | POA: Insufficient documentation

## 2015-10-21 DIAGNOSIS — F10120 Alcohol abuse with intoxication, uncomplicated: Secondary | ICD-10-CM | POA: Diagnosis not present

## 2015-10-21 DIAGNOSIS — F10920 Alcohol use, unspecified with intoxication, uncomplicated: Secondary | ICD-10-CM

## 2015-10-21 DIAGNOSIS — R4182 Altered mental status, unspecified: Secondary | ICD-10-CM | POA: Diagnosis not present

## 2015-10-21 LAB — CBC WITH DIFFERENTIAL/PLATELET
BASOS ABS: 0.1 10*3/uL (ref 0.0–0.1)
Basophils Relative: 1 %
Eosinophils Absolute: 0.2 10*3/uL (ref 0.0–0.7)
Eosinophils Relative: 3 %
HEMATOCRIT: 42.3 % (ref 39.0–52.0)
Hemoglobin: 14.3 g/dL (ref 13.0–17.0)
LYMPHS ABS: 2.1 10*3/uL (ref 0.7–4.0)
LYMPHS PCT: 34 %
MCH: 30.7 pg (ref 26.0–34.0)
MCHC: 33.8 g/dL (ref 30.0–36.0)
MCV: 90.8 fL (ref 78.0–100.0)
MONO ABS: 0.5 10*3/uL (ref 0.1–1.0)
Monocytes Relative: 7 %
NEUTROS ABS: 3.4 10*3/uL (ref 1.7–7.7)
Neutrophils Relative %: 55 %
Platelets: 135 10*3/uL — ABNORMAL LOW (ref 150–400)
RBC: 4.66 MIL/uL (ref 4.22–5.81)
RDW: 14.1 % (ref 11.5–15.5)
WBC: 6.2 10*3/uL (ref 4.0–10.5)

## 2015-10-21 LAB — COMPREHENSIVE METABOLIC PANEL
ALBUMIN: 4.1 g/dL (ref 3.5–5.0)
ALK PHOS: 77 U/L (ref 38–126)
ALT: 21 U/L (ref 17–63)
AST: 33 U/L (ref 15–41)
Anion gap: 14 (ref 5–15)
BILIRUBIN TOTAL: 0.1 mg/dL — AB (ref 0.3–1.2)
BUN: 7 mg/dL (ref 6–20)
CALCIUM: 9.2 mg/dL (ref 8.9–10.3)
CO2: 22 mmol/L (ref 22–32)
CREATININE: 0.81 mg/dL (ref 0.61–1.24)
Chloride: 111 mmol/L (ref 101–111)
GFR calc Af Amer: >60 mL/min (ref >60)
GFR calc non Af Amer: >60 mL/min (ref >60)
GLUCOSE: 102 mg/dL — AB (ref 65–99)
Potassium: 3.8 mmol/L (ref 3.5–5.1)
SODIUM: 147 mmol/L — AB (ref 135–145)
Total Protein: 7.5 g/dL (ref 6.5–8.1)

## 2015-10-21 LAB — MAGNESIUM: Magnesium: 2.1 mg/dL (ref 1.7–2.4)

## 2015-10-21 LAB — ETHANOL: Alcohol, Ethyl (B): 286 mg/dL — ABNORMAL HIGH (ref <5)

## 2015-10-21 LAB — CBG MONITORING, ED: GLUCOSE-CAPILLARY: 106 mg/dL — AB (ref 65–99)

## 2015-10-21 MED ORDER — SODIUM CHLORIDE 0.9 % IV BOLUS (SEPSIS)
1000.0000 mL | Freq: Once | INTRAVENOUS | Status: AC
  Administered 2015-10-21: 1000 mL via INTRAVENOUS

## 2015-10-21 MED ORDER — SODIUM CHLORIDE 0.9 % IV SOLN
1000.0000 mg | Freq: Once | INTRAVENOUS | Status: AC
  Administered 2015-10-21: 1000 mg via INTRAVENOUS
  Filled 2015-10-21: qty 10

## 2015-10-21 NOTE — Discharge Instructions (Signed)
Alcohol Intoxication Alcohol intoxication occurs when the amount of alcohol that a person has consumed impairs his or her ability to mentally and physically function. Alcohol directly impairs the normal chemical activity of the brain. Drinking large amounts of alcohol can lead to changes in mental function and behavior, and it can cause many physical effects that can be harmful.  Alcohol intoxication can range in severity from mild to very severe. Various factors can affect the level of intoxication that occurs, such as the person's age, gender, weight, frequency of alcohol consumption, and the presence of other medical conditions (such as diabetes, seizures, or heart conditions). Dangerous levels of alcohol intoxication may occur when people drink large amounts of alcohol in a short period (binge drinking). Alcohol can also be especially dangerous when combined with certain prescription medicines or "recreational" drugs. SIGNS AND SYMPTOMS Some common signs and symptoms of mild alcohol intoxication include:  Loss of coordination.  Changes in mood and behavior.  Impaired judgment.  Slurred speech. As alcohol intoxication progresses to more severe levels, other signs and symptoms will appear. These may include:  Vomiting.  Confusion and impaired memory.  Slowed breathing.  Seizures.  Loss of consciousness. DIAGNOSIS  Your health care provider will take a medical history and perform a physical exam. You will be asked about the amount and type of alcohol you have consumed. Blood tests will be done to measure the concentration of alcohol in your blood. In many places, your blood alcohol level must be lower than 80 mg/dL (0.08%) to legally drive. However, many dangerous effects of alcohol can occur at much lower levels.  TREATMENT  People with alcohol intoxication often do not require treatment. Most of the effects of alcohol intoxication are temporary, and they go away as the alcohol naturally  leaves the body. Your health care provider will monitor your condition until you are stable enough to go home. Fluids are sometimes given through an IV access tube to help prevent dehydration.  HOME CARE INSTRUCTIONS  Do not drive after drinking alcohol.  Stay hydrated. Drink enough water and fluids to keep your urine clear or pale yellow. Avoid caffeine.   Only take over-the-counter or prescription medicines as directed by your health care provider.  SEEK MEDICAL CARE IF:   You have persistent vomiting.   You do not feel better after a few days.  You have frequent alcohol intoxication. Your health care provider can help determine if you should see a substance use treatment counselor. SEEK IMMEDIATE MEDICAL CARE IF:   You become shaky or tremble when you try to stop drinking.   You shake uncontrollably (seizure).   You throw up (vomit) blood. This may be bright red or may look like black coffee grounds.   You have blood in your stool. This may be bright red or may appear as a black, tarry, bad smelling stool.   You become lightheaded or faint.  MAKE SURE YOU:   Understand these instructions.  Will watch your condition.  Will get help right away if you are not doing well or get worse.   This information is not intended to replace advice given to you by your health care provider. Make sure you discuss any questions you have with your health care provider.   Document Released: 07/01/2005 Document Revised: 05/24/2013 Document Reviewed: 02/24/2013 Elsevier Interactive Patient Education [employer].  Epilepsy Epilepsy is a disorder in which a person has repeated seizures over time. A seizure is a release of abnormal  electrical activity in the brain. Seizures can cause a change in attention, behavior, or the ability to remain awake and alert (altered mental status). Seizures often involve uncontrollable shaking (convulsions).  Most people with epilepsy lead normal  lives. However, people with epilepsy are at an increased risk of falls, accidents, and injuries. Therefore, it is important to begin treatment right away. CAUSES  Epilepsy has many possible causes. Anything that disturbs the normal pattern of brain cell activity can lead to seizures. This may include:   Head injury.  Birth trauma.  High fever as a child.  Stroke.  Bleeding into or around the brain.  Certain drugs.  Prolonged low oxygen, such as what occurs after CPR efforts.  Abnormal brain development.  Certain illnesses, such as meningitis, encephalitis (brain infection), malaria, and other infections.  An imbalance of nerve signaling chemicals (neurotransmitters).  SIGNS AND SYMPTOMS  The symptoms of a seizure can vary greatly from one person to another. Right before a seizure, you may have a warning (aura) that a seizure is about to occur. An aura may include the following symptoms:  Fear or anxiety.  Nausea.  Feeling like the room is spinning (vertigo).  Vision changes, such as seeing flashing lights or spots. Common symptoms during a seizure include:  Abnormal sensations, such as an abnormal smell or a bitter taste in the mouth.   Sudden, general body stiffness.   Convulsions that involve rhythmic jerking of the face, arm, or leg on one or both sides.   Sudden change in consciousness.   Appearing to be awake but not responding.   Appearing to be asleep but cannot be awakened.   Grimacing, chewing, lip smacking, drooling, tongue biting, or loss of bowel or bladder control. After a seizure, you may feel sleepy for a while. DIAGNOSIS  Your health care provider will ask about your symptoms and take a medical history. Descriptions from any witnesses to your seizures will be very helpful in the diagnosis. A physical exam, including a detailed neurological exam, is necessary. Various tests may be done, such as:   An electroencephalogram (EEG). This is a  painless test of your brain waves. In this test, a diagram is created of your brain waves. These diagrams can be interpreted by a specialist.  An MRI of the brain.   A CT scan of the brain.   A spinal tap (lumbar puncture, LP).  Blood tests to check for signs of infection or abnormal blood chemistry. TREATMENT  There is no cure for epilepsy, but it is generally treatable. Once epilepsy is diagnosed, it is important to begin treatment as soon as possible. For most people with epilepsy, seizures can be controlled with medicines. The following may also be used:  A pacemaker for the brain (vagus nerve stimulator) can be used for people with seizures that are not well controlled by medicine.  Surgery on the brain. For some people, epilepsy eventually goes away. HOME CARE INSTRUCTIONS   Follow your health care provider's recommendations on driving and safety in normal activities.  Get enough rest. Lack of sleep can cause seizures.  Only take over-the-counter or prescription medicines as directed by your health care provider. Take any prescribed medicine exactly as directed.  Avoid any known triggers of your seizures.  Keep a seizure diary. Record what you recall about any seizure, especially any possible trigger.   Make sure the people you live and work with know that you are prone to seizures. They should receive instructions on  how to help you. In general, a witness to a seizure should:   Cushion your head and body.   Turn you on your side.   Avoid unnecessarily restraining you.   Not place anything inside your mouth.   Call for emergency medical help if there is any question about what has occurred.   Follow up with your health care provider as directed. You may need regular blood tests to monitor the levels of your medicine.  SEEK MEDICAL CARE IF:   You develop signs of infection or other illness. This might increase the risk of a seizure.   You seem to be having  more frequent seizures.   Your seizure pattern is changing.  SEEK IMMEDIATE MEDICAL CARE IF:   You have a seizure that does not stop after a few moments.   You have a seizure that causes any difficulty in breathing.   You have a seizure that results in a very severe headache.   You have a seizure that leaves you with the inability to speak or use a part of your body.    This information is not intended to replace advice given to you by your health care provider. Make sure you discuss any questions you have with your health care provider.   Document Released: 09/21/2005 Document Revised: 07/12/2013 Document Reviewed: 05/03/2013 Elsevier Interactive Patient Education [employer].

## 2015-10-21 NOTE — ED Notes (Signed)
Bed: [id number] Expected date:  Expected time:  Means of arrival:  Comments: Seizure, postictal

## 2015-10-21 NOTE — ED Notes (Signed)
According to EMS, pts friends states pt showed up on their house confused and began to have seizure activity. Pt arrives to [facility] ED alert, odor of alcohol on his breath, stating random words, in no acute distress.   EMS Vitals BP 132 (palpated) P 84  CBG 123
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
## 2015-10-21 NOTE — ED Notes (Signed)
CBG 106 

## 2015-10-22 ENCOUNTER — Emergency Department (HOSPITAL_COMMUNITY)
Admission: EM | Admit: 2015-10-22 | Discharge: 2015-10-22 | Disposition: A | Discharge status: Home / Self Care | Payer: Medicaid Other | Attending: Physician Assistant | Admitting: Physician Assistant

## 2015-10-22 ENCOUNTER — Encounter (HOSPITAL_COMMUNITY): Payer: Self-pay | Admitting: *Deleted

## 2015-10-22 DIAGNOSIS — F1010 Alcohol abuse, uncomplicated: Secondary | ICD-10-CM | POA: Insufficient documentation

## 2015-10-22 DIAGNOSIS — Z8701 Personal history of pneumonia (recurrent): Secondary | ICD-10-CM | POA: Diagnosis not present

## 2015-10-22 DIAGNOSIS — F17200 Nicotine dependence, unspecified, uncomplicated: Secondary | ICD-10-CM | POA: Insufficient documentation

## 2015-10-22 DIAGNOSIS — Z79899 Other long term (current) drug therapy: Secondary | ICD-10-CM | POA: Diagnosis not present

## 2015-10-22 DIAGNOSIS — Z7951 Long term (current) use of inhaled steroids: Secondary | ICD-10-CM | POA: Diagnosis not present

## 2015-10-22 LAB — CBG MONITORING, ED: Glucose-Capillary: 89 mg/dL (ref 65–99)

## 2015-10-22 NOTE — ED Provider Notes (Signed)
I received this patient in signout from Dr. Oleta Mouse, who reported that he presented up with seizure activity in the setting of alcohol intoxication. We were awaiting patient to metabolize alcohol. On reexamination, the patient was sitting up, talking and able to ambulate to the restroom. He has been eating and drinking without problems in the ED. I feel he is clinically sober enough to be discharged safely. He feels comfortable with discharge and is calling a taxi. Return precautions reviewed and patient voiced understanding.  Sharlett Iles, MD 10/22/15 [phone]

## 2015-10-22 NOTE — ED Notes (Signed)
Pt appears to be sleeping soundly, in no acute distress.
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
## 2015-10-22 NOTE — Discharge Instructions (Signed)
You have been seen today for increased alcohol consumption. Follow up with PCP as soon as possible to get refills for your seizure medications. Return to ED should symptoms worsen.
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
## 2015-10-22 NOTE — ED Notes (Signed)
Bed: [facility] Expected date:  Expected time:  Means of arrival:  Comments: EMS- 55yo M, seizure?

## 2015-10-22 NOTE — ED Notes (Signed)
Patient threatening and simulating seizure activity initially. He is now engaged in conversation with security.

## 2015-10-23 ENCOUNTER — Encounter (HOSPITAL_COMMUNITY): Payer: Self-pay | Admitting: Emergency Medicine

## 2015-10-23 ENCOUNTER — Emergency Department (HOSPITAL_COMMUNITY)
Admission: EM | Admit: 2015-10-23 | Discharge: 2015-10-23 | Disposition: A | Discharge status: Other Acute Inpatient Hospital | Payer: Medicaid Other | Attending: Emergency Medicine | Admitting: Emergency Medicine

## 2015-10-23 ENCOUNTER — Inpatient Hospital Stay (HOSPITAL_COMMUNITY)
Admission: EM | Admit: 2015-10-23 | Discharge: 2015-10-26 | DRG: 885 | Disposition: A | Discharge status: Home / Self Care | Payer: Medicaid Other | Source: Intra-hospital | Attending: Psychiatry | Admitting: Psychiatry

## 2015-10-23 ENCOUNTER — Encounter (HOSPITAL_COMMUNITY): Payer: Self-pay | Admitting: General Practice

## 2015-10-23 DIAGNOSIS — F10239 Alcohol dependence with withdrawal, unspecified: Secondary | ICD-10-CM | POA: Diagnosis present

## 2015-10-23 DIAGNOSIS — F10232 Alcohol dependence with withdrawal with perceptual disturbance: Secondary | ICD-10-CM | POA: Diagnosis not present

## 2015-10-23 DIAGNOSIS — R569 Unspecified convulsions: Secondary | ICD-10-CM

## 2015-10-23 DIAGNOSIS — R45851 Suicidal ideations: Secondary | ICD-10-CM

## 2015-10-23 DIAGNOSIS — Z87898 Personal history of other specified conditions: Secondary | ICD-10-CM | POA: Insufficient documentation

## 2015-10-23 DIAGNOSIS — Z79899 Other long term (current) drug therapy: Secondary | ICD-10-CM | POA: Insufficient documentation

## 2015-10-23 DIAGNOSIS — Z8701 Personal history of pneumonia (recurrent): Secondary | ICD-10-CM | POA: Diagnosis not present

## 2015-10-23 DIAGNOSIS — F10229 Alcohol dependence with intoxication, unspecified: Secondary | ICD-10-CM | POA: Diagnosis present

## 2015-10-23 DIAGNOSIS — F1020 Alcohol dependence, uncomplicated: Secondary | ICD-10-CM | POA: Diagnosis present

## 2015-10-23 DIAGNOSIS — F17200 Nicotine dependence, unspecified, uncomplicated: Secondary | ICD-10-CM | POA: Insufficient documentation

## 2015-10-23 DIAGNOSIS — Z811 Family history of alcohol abuse and dependence: Secondary | ICD-10-CM | POA: Diagnosis not present

## 2015-10-23 DIAGNOSIS — F329 Major depressive disorder, single episode, unspecified: Secondary | ICD-10-CM | POA: Diagnosis present

## 2015-10-23 DIAGNOSIS — F10939 Alcohol use, unspecified with withdrawal, unspecified: Secondary | ICD-10-CM | POA: Clinically undetermined

## 2015-10-23 DIAGNOSIS — F332 Major depressive disorder, recurrent severe without psychotic features: Principal | ICD-10-CM | POA: Clinically undetermined

## 2015-10-23 DIAGNOSIS — F1024 Alcohol dependence with alcohol-induced mood disorder: Secondary | ICD-10-CM | POA: Diagnosis present

## 2015-10-23 DIAGNOSIS — F10280 Alcohol dependence with alcohol-induced anxiety disorder: Secondary | ICD-10-CM | POA: Diagnosis present

## 2015-10-23 DIAGNOSIS — Z7951 Long term (current) use of inhaled steroids: Secondary | ICD-10-CM | POA: Insufficient documentation

## 2015-10-23 DIAGNOSIS — F1010 Alcohol abuse, uncomplicated: Secondary | ICD-10-CM | POA: Diagnosis present

## 2015-10-23 LAB — COMPREHENSIVE METABOLIC PANEL
ALT: 23 U/L (ref 17–63)
ANION GAP: 15 (ref 5–15)
AST: 38 U/L (ref 15–41)
Albumin: 3.7 g/dL (ref 3.5–5.0)
Alkaline Phosphatase: 79 U/L (ref 38–126)
BUN: 8 mg/dL (ref 6–20)
CHLORIDE: 106 mmol/L (ref 101–111)
CO2: 21 mmol/L — AB (ref 22–32)
Calcium: 8.4 mg/dL — ABNORMAL LOW (ref 8.9–10.3)
Creatinine, Ser: 0.86 mg/dL (ref 0.61–1.24)
GFR calc non Af Amer: >60 mL/min (ref >60)
Glucose, Bld: 95 mg/dL (ref 65–99)
POTASSIUM: 3.6 mmol/L (ref 3.5–5.1)
SODIUM: 142 mmol/L (ref 135–145)
Total Bilirubin: 0.5 mg/dL (ref 0.3–1.2)
Total Protein: 6.8 g/dL (ref 6.5–8.1)

## 2015-10-23 LAB — CBC WITH DIFFERENTIAL/PLATELET
Basophils Absolute: 0.1 10*3/uL (ref 0.0–0.1)
Basophils Relative: 1 %
EOS PCT: 5 %
Eosinophils Absolute: 0.3 10*3/uL (ref 0.0–0.7)
HEMATOCRIT: 40.3 % (ref 39.0–52.0)
HEMOGLOBIN: 13.1 g/dL (ref 13.0–17.0)
LYMPHS ABS: 2 10*3/uL (ref 0.7–4.0)
LYMPHS PCT: 36 %
MCH: 30.3 pg (ref 26.0–34.0)
MCHC: 32.5 g/dL (ref 30.0–36.0)
MCV: 93.1 fL (ref 78.0–100.0)
Monocytes Absolute: 0.4 10*3/uL (ref 0.1–1.0)
Monocytes Relative: 7 %
NEUTROS ABS: 2.9 10*3/uL (ref 1.7–7.7)
Neutrophils Relative %: 51 %
PLATELETS: 135 10*3/uL — AB (ref 150–400)
RBC: 4.33 MIL/uL (ref 4.22–5.81)
RDW: 14.2 % (ref 11.5–15.5)
WBC: 5.6 10*3/uL (ref 4.0–10.5)

## 2015-10-23 LAB — RAPID URINE DRUG SCREEN, HOSP PERFORMED
AMPHETAMINES: NOT DETECTED
BARBITURATES: NOT DETECTED
BENZODIAZEPINES: NOT DETECTED
Cocaine: NOT DETECTED
Opiates: NOT DETECTED
TETRAHYDROCANNABINOL: NOT DETECTED

## 2015-10-23 LAB — ETHANOL: Alcohol, Ethyl (B): 162 mg/dL — ABNORMAL HIGH (ref <5)

## 2015-10-23 MED ORDER — LORAZEPAM 1 MG PO TABS
1.0000 mg | ORAL_TABLET | Freq: Two times a day (BID) | ORAL | Status: DC
  Administered 2015-10-26: 1 mg via ORAL
  Filled 2015-10-23: qty 1

## 2015-10-23 MED ORDER — ACETAMINOPHEN 325 MG PO TABS
650.0000 mg | ORAL_TABLET | ORAL | Status: DC | PRN
  Administered 2015-10-24: 650 mg via ORAL
  Filled 2015-10-23: qty 2

## 2015-10-23 MED ORDER — HYDROXYZINE HCL 25 MG PO TABS: 25.0000 mg | ORAL_TABLET | Freq: Four times a day (QID) | ORAL | Status: DC | PRN

## 2015-10-23 MED ORDER — ALUM & MAG HYDROXIDE-SIMETH 200-200-20 MG/5ML PO SUSP: 30.0000 mL | ORAL | Status: DC | PRN

## 2015-10-23 MED ORDER — MAGNESIUM HYDROXIDE 400 MG/5ML PO SUSP: 30.0000 mL | Freq: Every day | ORAL | Status: DC | PRN

## 2015-10-23 MED ORDER — ONDANSETRON HCL 4 MG PO TABS: 4.0000 mg | ORAL_TABLET | Freq: Three times a day (TID) | ORAL | Status: DC | PRN

## 2015-10-23 MED ORDER — ACETAMINOPHEN 325 MG PO TABS: 650.0000 mg | ORAL_TABLET | ORAL | Status: DC | PRN

## 2015-10-23 MED ORDER — LORAZEPAM 1 MG PO TABS
1.0000 mg | ORAL_TABLET | Freq: Four times a day (QID) | ORAL | Status: DC | PRN
  Administered 2015-10-24: 1 mg via ORAL
  Filled 2015-10-23: qty 1

## 2015-10-23 MED ORDER — ONDANSETRON HCL 4 MG PO TABS
4.0000 mg | ORAL_TABLET | Freq: Three times a day (TID) | ORAL | Status: DC | PRN
  Administered 2015-10-23: 4 mg via ORAL
  Filled 2015-10-23: qty 1

## 2015-10-23 MED ORDER — LORAZEPAM 1 MG PO TABS
2.0000 mg | ORAL_TABLET | Freq: Once | ORAL | Status: AC
  Administered 2015-10-23: 2 mg via ORAL
  Filled 2015-10-23: qty 2

## 2015-10-23 MED ORDER — LORAZEPAM 1 MG PO TABS: 1.0000 mg | ORAL_TABLET | Freq: Every day | ORAL | Status: DC

## 2015-10-23 MED ORDER — LOPERAMIDE HCL 2 MG PO CAPS: 2.0000 mg | ORAL_CAPSULE | ORAL | Status: DC | PRN

## 2015-10-23 MED ORDER — LORAZEPAM 1 MG PO TABS
1.0000 mg | ORAL_TABLET | Freq: Four times a day (QID) | ORAL | Status: AC
  Administered 2015-10-23 – 2015-10-24 (×6): 1 mg via ORAL
  Filled 2015-10-23 (×5): qty 1

## 2015-10-23 MED ORDER — LORAZEPAM 1 MG PO TABS
1.0000 mg | ORAL_TABLET | Freq: Three times a day (TID) | ORAL | Status: AC
  Administered 2015-10-25 (×2): 1 mg via ORAL
  Filled 2015-10-23 (×3): qty 1

## 2015-10-23 MED ORDER — VITAMIN B-1 100 MG PO TABS
100.0000 mg | ORAL_TABLET | Freq: Every day | ORAL | Status: DC
  Administered 2015-10-24 – 2015-10-26 (×3): 100 mg via ORAL
  Filled 2015-10-23 (×7): qty 1

## 2015-10-23 NOTE — BH Assessment (Signed)
[place] Assessment Progress Note  Per shift report, pt has been accepted to [facility] by Patriciaann Clan, PA, and assigned to Rm 301-2 by Inocencio Homes, RN, AC.  Pt has signed Consent for Admission and Treatment, as well as Consent to Release Information to the [facility] and [facility].  Signed forms have been faxed to [facility].  Pt's nurse, Chrys Racer, has been notified and agrees to send original documents along with pt via [facility], and to call report to [phone].  Jalene Mullet, [place] Triage Specialist [phone]

## 2015-10-23 NOTE — BHH Counselor (Signed)
Pt under consideration for IP bed at [facility].    Per Inocencio Homes, [facility], pt will need updated, morning vitals and a CIWA in addition to the complete BAL and UDS before [facility] admission and final disposition can take place.  Faylene Kurtz, MS, CRC, [place] Triage Specialist [facility]

## 2015-10-23 NOTE — ED Notes (Signed)
Pt attempted to obtain a urine sample and was unable to obtain one.

## 2015-10-23 NOTE — BHH Counselor (Addendum)
Per Inocencio Homes, AC: Once BAL, UDS, updated vitals and CIWA are complete and reviewed by [facility], pt can be accepted to [facility].  Assigned room 301-2, Attending Dr. Sabra Heck tentatively.    After review, pt can come after 8 am   Faylene Kurtz, MS, [facility], [place] Triage Specialist [facility]

## 2015-10-23 NOTE — Progress Notes (Signed)
Patient ID: [name], male   DOB: December 24, 1960, 56 y.o.   MRN: [id number] D: Client in bed this shift, reports a history of seizures, but does not know when last seizure was, also reports he has been of medication for a while. Client reports he is here for detox and more things. A: Writer encouraged client to report any concerns. Medications reviewed and administered as ordered. Staff will monitor q35min for safety. R: Client is safe on the unit, did not attend group.

## 2015-10-23 NOTE — ED Notes (Addendum)
Patient has been wanded by security, belongings include shoes, clothing lighter, black wallet, [place] ID, and two keys.
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
## 2015-10-23 NOTE — Progress Notes (Deleted)
Pt attended NA group this evening.  

## 2015-10-23 NOTE — ED Notes (Signed)
Patient ambulated to SAPU. Belongings transported with patient.

## 2015-10-23 NOTE — Tx Team (Signed)
Initial Interdisciplinary Treatment Plan   PATIENT STRESSORS: Loss of "my old lady" Substance abuse   PATIENT STRENGTHS: Curator fund of knowledge   PROBLEM LIST: Problem List/Patient Goals Date to be addressed Date deferred Reason deferred Estimated date of resolution  Substance Abuse  10/23/2015           Suicide Risk  10/23/2015           Depression 10/23/2015           "Try to find a job" 10/23/2015           "Get another place to stay" 10/23/2015      DISCHARGE CRITERIA:  Adequate post-discharge living arrangements Improved stabilization in mood, thinking, and/or behavior Verbal commitment to aftercare and medication compliance Withdrawal symptoms are absent or subacute and managed without 24-hour nursing intervention  PRELIMINARY DISCHARGE PLAN: Attend PHP/IOP Attend 12-step recovery group Outpatient therapy  PATIENT/FAMIILY INVOLVEMENT: This treatment plan has been presented to and reviewed with the patient, [name].  The patient and family have been given the opportunity to ask questions and make suggestions.  Gaylan Gerold E 10/23/2015, 7:04 PM

## 2015-10-23 NOTE — BHH Counselor (Signed)
Charlann Lange, PA called inquiring about pt status and to inform that all labs were back. This counselor informed PA of pt's acceptance to [facility] under Dr Sabra Heck after 08:00 AM, pending CIWA score.

## 2015-10-23 NOTE — ED Notes (Signed)
Patient has been accepted to [facility].

## 2015-10-23 NOTE — ED Notes (Signed)
Patient discharged from sappu.  [facility] transportation transporting patient to [facility].  Report had been called previously to [place] D., RN.  Patient's belongings were transferred with patient.  He left ambulatory with [facility] driver.

## 2015-10-23 NOTE — ED Notes (Signed)
Patient presents for SI. Reports plan to jump off a bridge. Denies AVH. Reports ETOH approximately two hours ago.

## 2015-10-23 NOTE — ED Provider Notes (Signed)
CSN: [id number]     Arrival date & time 10/23/15  0039 History   First MD Initiated Contact with Patient 10/23/15 0053     No chief complaint on file.    (Consider location/radiation/quality/duration/timing/severity/associated sxs/prior Treatment) HPI Comments: Patient with a history alcohol dependence and seizures presents to emergency department with police with complaint of suicidal ideations. He has been seen here 1/13, 1/16, 1/17 for alcohol intoxication, now with SI stating he wants to jump on a bridge. Denies hallucinations, HI.   The history is provided by the patient and the police. No language interpreter was used.    Past Medical History  Diagnosis Date  . Seizures ([place])   . Pneumonia   . Alcohol abuse    Past Surgical History  Procedure Laterality Date  . Brain tumor excision     Family History  Problem Relation Age of Onset  . Alcoholism Mother   . Alcoholism Father    Social History  Substance Use Topics  . Smoking status: Current Some Day Smoker  . Smokeless tobacco: None  . Alcohol Use: 0.6 oz/week    1 Cans of beer per week    Review of Systems  Constitutional: Negative for fever and chills.  Respiratory: Negative.  Negative for cough and shortness of breath.   Cardiovascular: Negative.   Gastrointestinal: Negative.   Musculoskeletal: Negative.   Skin: Negative.   Neurological: Negative.   Psychiatric/Behavioral: Positive for suicidal ideas. Negative for self-injury.      Allergies  Borax  Home Medications   Prior to Admission medications   Medication Sig Start Date End Date Taking? Authorizing Provider  acetaminophen-codeine (TYLENOL #3) 300-30 MG per tablet Take 1 tablet by mouth every 8 (eight) hours as needed for moderate pain. 04/12/15   Josalyn Funches, MD  fluticasone (FLONASE) 50 MCG/ACT nasal spray Place 2 sprays into both nostrils daily. 04/12/15   [name], MD  folic acid (FOLVITE) 1 MG tablet Take 1 tablet (1 mg total) by mouth  daily. 04/12/15   Josalyn Funches, MD  levETIRAcetam (KEPPRA) 500 MG tablet Take 1 tablet (500 mg total) by mouth 2 (two) times daily. 04/12/15   Josalyn Funches, MD  methylPREDNISolone (MEDROL DOSEPAK) 4 MG TBPK tablet Per packet insert 04/12/15   Josalyn Funches, MD  ranitidine (ZANTAC) 150 MG tablet Take 1 tablet (150 mg total) by mouth 2 (two) times daily. 04/12/15   Josalyn Funches, MD  thiamine (VITAMIN B-1) 100 MG tablet Take 1 tablet (100 mg total) by mouth daily. 04/12/15   Josalyn Funches, MD  Vitamin D, Ergocalciferol, (DRISDOL) 50000 UNITS CAPS capsule Take 1 capsule (50,000 Units total) by mouth every 7 (seven) days. For 12 weeks 04/15/15   Josalyn Funches, MD   BP 160/95 mmHg  Pulse 96  Temp(Src) 98.1 F (36.7 C) (Oral)  Resp 18  SpO2 95% Physical Exam  Constitutional: He is oriented to person, place, and time. He appears well-developed and well-nourished. No distress.  HENT:  Head: Normocephalic.  Neck: Normal range of motion. Neck supple.  Cardiovascular: Normal rate and regular rhythm.   Pulmonary/Chest: Effort normal and breath sounds normal.  Abdominal: Soft. Bowel sounds are normal. There is no tenderness. There is no rebound and no guarding.  Musculoskeletal: Normal range of motion.  Neurological: He is alert and oriented to person, place, and time.  Skin: Skin is warm and dry. No rash noted.  Psychiatric: His speech is delayed. He is withdrawn. He exhibits a depressed mood. He expresses suicidal  ideation.    ED Course  Procedures (including critical care time) Labs Review Labs Reviewed - No data to display  Imaging Review No results found. I have personally reviewed and evaluated these images and lab results as part of my medical decision-making.   EKG Interpretation None      MDM   Final diagnoses:  None    1. Suicidal ideation 2. Alcohol dependence  The patient will require TTS evaluation for suicidal ideation.  CIWA protocol in place with his history  alcohol abuse. He is currently stable and can be placed in [facility] unit to wait for evaluation.     Charlann Lange, PA-C 10/23/15 [place], MD 10/24/15 2002

## 2015-10-23 NOTE — Progress Notes (Signed)
Patient ID: [name], male   DOB: [birth year], 55 y.o.   MRN: [id number]  [name]. Is an [place] American male admitted to [facility] for substance abuse and suicidal ideation. He reports drinking atleast a twelve pack a day. He reports PTA his "old lady" left him therefore he has no support systems. He reports a past medical history of seizures and gout. MD made aware that patient has seizure disorder and the PTA medications. He denies SI/HI and A/V hallucinations during admission process. Patient forwards very little during assessment and is minimal. Patient is oriented to the unit and verbalizes understanding of admission process. Patient's BP remains elevated however patient is asymptomatic. MD Sabra Heck and NP [place] notified. Orders for Ativan protocol were placed.  He reports mild withdrawal symptoms at this time. Q15 minute safety checks initiated and are maintained. No distress on admission.

## 2015-10-23 NOTE — ED Notes (Signed)
Patient reports SI and ETOH abuse. States that both arms and feet are hurting. Reports a fall "in the last couple of days" related to a seizure.   Encouragement offered. Snack provided.  Q 15 safety checks in place.  Chrys Racer RN aware of pt status.

## 2015-10-23 NOTE — BHH Counselor (Signed)
Attempted to call nurse's station 5 x to clarify additional information needed before pt can come to [facility].  Was not successful.  Faylene Kurtz, MS, [facility], [place] Triage Specialist [facility]

## 2015-10-24 ENCOUNTER — Encounter (HOSPITAL_COMMUNITY): Payer: Self-pay | Admitting: Psychiatry

## 2015-10-24 DIAGNOSIS — F1024 Alcohol dependence with alcohol-induced mood disorder: Secondary | ICD-10-CM | POA: Diagnosis present

## 2015-10-24 LAB — AMMONIA: Ammonia: 29 umol/L (ref 9–35)

## 2015-10-24 LAB — MAGNESIUM: Magnesium: 2 mg/dL (ref 1.7–2.4)

## 2015-10-24 MED ORDER — LEVETIRACETAM 500 MG PO TABS
500.0000 mg | ORAL_TABLET | Freq: Two times a day (BID) | ORAL | Status: DC
  Administered 2015-10-24 – 2015-10-26 (×4): 500 mg via ORAL
  Filled 2015-10-24 (×11): qty 1

## 2015-10-24 MED ORDER — OLANZAPINE 5 MG PO TBDP: 5.0000 mg | ORAL_TABLET | Freq: Four times a day (QID) | ORAL | Status: DC | PRN

## 2015-10-24 MED ORDER — LORAZEPAM 1 MG PO TABS
1.0000 mg | ORAL_TABLET | ORAL | Status: DC | PRN
  Administered 2015-10-25: 1 mg via ORAL
  Filled 2015-10-24: qty 1

## 2015-10-24 NOTE — BHH Suicide Risk Assessment (Signed)
[facility] INPATIENT:  Family/Significant Other Suicide Prevention Education  Suicide Prevention Education:  Patient Refusal for Family/Significant Other Suicide Prevention Education: The patient [name] has refused to provide written consent for family/significant other to be provided Family/Significant Other Suicide Prevention Education during admission and/or prior to discharge.  Physician notified.  SPE completed with pt, as pt refused to consent to family contact. SPI pamphlet provided to pt and pt was encouraged to share information with support network, ask questions, and talk about any concerns relating to SPE. Pt denies access to guns/firearms and verbalized understanding of information provided. Mobile Crisis information also provided to pt.   Smart, Jamaurie Bernier LCSW 10/24/2015, 2:47 PM

## 2015-10-24 NOTE — BHH Suicide Risk Assessment (Signed)
[facility] Admission Suicide Risk Assessment   Nursing information obtained from:  Patient Demographic factors:  Male Current Mental Status:  NA Loss Factors:  NA Historical Factors:  NA Risk Reduction Factors:  NA  Total Time spent with patient: 45 minutes Principal Problem: Alcohol dependence with alcohol-induced mood disorder ([place]) Diagnosis:   Patient Active Problem List   Diagnosis Date Noted  . Alcohol dependence with alcohol-induced mood disorder ([place]) [F10.24] 10/24/2015  . Alcohol dependence ([place]) [F10.20] 10/23/2015  . Vitamin D deficiency [E55.9] 04/15/2015  . ETOH abuse [F10.10] 04/12/2015  . Screening for HIV (human immunodeficiency virus) [Z11.4] 04/12/2015  . 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 Lipoma of flank [D17.1] 04/12/2015  . Seizures ([place]) [R56.9] 04/12/2015  . Persistent headaches [R51] 04/12/2015  . Horizontal nystagmus [H55.09] 04/12/2015  . Hypocalcemia [E83.51] 04/12/2015   Subjective Data: see admission H and P  Continued Clinical Symptoms:  Alcohol Use Disorder Identification Test Final Score (AUDIT): 26 The "Alcohol Use Disorders Identification Test", Guidelines for Use in Primary Care, Second Edition.  World Pharmacologist [facility]). Score between 0-7:  no or low risk or alcohol related problems. Score between 8-15:  moderate risk of alcohol related problems. Score between 16-19:  high risk of alcohol related problems. Score 20 or above:  warrants further diagnostic evaluation for alcohol dependence and treatment.   CLINICAL FACTORS:   Alcohol/Substance Abuse/Dependencies  Psychiatric Specialty Exam: ROS  Blood pressure 161/99, pulse 97, temperature 98.4 F (36.9 C), temperature source Oral, resp. rate 20, height 5\' 5"  (1.651 m), weight 72.576 kg (160 lb), SpO2 98 %.Body mass index is 26.63 kg/(m^2).   COGNITIVE FEATURES THAT CONTRIBUTE TO RISK:  Closed-mindedness, Polarized thinking and Thought constriction (tunnel vision)    SUICIDE  RISK:   Mild:  Suicidal ideation of limited frequency, intensity, duration, and specificity.  There are no identifiable plans, no associated intent, mild dysphoria and related symptoms, good self-control (both objective and subjective assessment), few other risk factors, and identifiable protective factors, including available and accessible social support.  PLAN OF CARE: see admission H and P  I certify that inpatient services furnished can reasonably be expected to improve the patient's condition.   Nicholaus Bloom, MD 10/24/2015, 3:21 PM

## 2015-10-24 NOTE — BHH Counselor (Signed)
Pt discharging within 24 hours of admission. Psychosocial assessment not required. Pt is not willing to complete regardless.   Maxie Better, MSW, LCSW Clinical Social Worker 10/24/2015 2:50 PM

## 2015-10-24 NOTE — Progress Notes (Signed)
  [facility] Adult Case Management Discharge Plan :  Will you be returning to the same living situation after discharge:  Yes,  home At discharge, do you have transportation home?: Yes,  bus pass in chart. Per Dr. Sabra Heck, pt is scheduled for morning d/c on Friday 10/25/15 Do you have the ability to pay for your medications: Yes,  [facility] Medicaid  Release of information consent forms completed and submitted to medical records by CSW.  Patient to Follow up at: Follow-up Information    Follow up with [facility].   Why:  Walk in between 8am-9am Monday through Friday for hospital follow-up/medication management/assessment for counseling services.    Contact information:   [address], [place] [address] Phone: [phone] Fax: [phone]      Next level of care provider has access to [place] and Suicide Prevention discussed: Yes,  SPE completed with pt, as he refused to consent to family contact. SPI pamphlet and mobile crisis information provided.   Have you used any form of tobacco in the last 30 days? (Cigarettes, Smokeless Tobacco, Cigars, and/or Pipes): Yes  Has patient been referred to the Quitline?: Patient refused referral  Patient has been referred for addiction treatment: Pt. refused referral  Smart, Nanette Wirsing LCSW 10/24/2015, 2:47 PM

## 2015-10-24 NOTE — Tx Team (Addendum)
Interdisciplinary Treatment Plan Update (Adult)  Date:  10/24/2015  Time Reviewed:  8:21 AM   Progress in Treatment: Attending groups: No.  Participating in groups:  No. Taking medication as prescribed:  Yes. Tolerating medication:  Yes. Family/Significant othe contact made:  SPE completed with pt, as he refused to consent to family contact.  Patient understands diagnosis:  Yes. and As evidenced by:  seeking treatment for depression, alcohol abuse, mood instability, and medication management.  Discussing patient identified problems/goals with staff:  Yes. Medical problems stabilized or resolved:  Yes. Denies suicidal/homicidal ideation: No.Passive SI/Able to contract for safety on the unit.  Issues/concerns per patient self-inventory:  Other:  Discharge Plan or Barriers: Pt requesting d/c immediately. Dr. Sabra Heck told pt that he could d/c Friday morning. Pt anxious to return to work. Per his request, bus pass placed in chart. He plans to continue follow-up at [facility].   10/25/15: Pt has been transferred to [address] hall due to an attempt to [place]. Pt is undergoing detox and will not d/c today.  Reason for Continuation of Hospitalization: None  Comments:  [name] is an 55 y.o.single male who has a hx of alcohol intoxication. Pt was found on the side of the road and called 911 tonight and was brought in by the PD voluntarily. Pt was seen in the ED on 1/13 and 1/16 for alcohol intoxication and appears intoxicated again tonight. Pt's BAL and UDS are incomplete at this time. Pt sts he consumed "a lot" of alcohol tonight before coming to the ED. Pt sts that he usually consumes about 2 to 3 - 40 oz beers each time he drinks alcohol and he sts he drinks alcohol about 3-4 times a week. Pt sts he also smokes marijuana about 1 x per month and smokes cigarettes daily. Pt sts he is upset over his wife leaving him stating "she broke my heart." Pt sts that he has been having SI with a plan to jump  from a bridge to kill himself. Pt denies HI, SHI and AVH. Pt denies a hx of physical or verbal aggression and appears calm although irritable at this time. Pt sts he has no family or other individuals that support him. Pt sts he lives alone. Pt sts he has no hx of IP admissions or OPT. Pt denies physical, emotional/verbal and sexual abuse. Pt denies any hx of suicide or MH issues in his family to his knowledge. Symptoms of depression include deep sadness, fatigue, excessive guilt, decreased self esteem, tearfulness & crying spells, self isolation, lack of motivation for activities and pleasure, irritability, negative outlook, difficulty thinking & concentrating, feeling helpless and hopeless, sleep and eating disturbances. Pt denies symptoms of anxiety. Diagnosis: 311 Unspecified Depressive Disorder  Estimated length of stay:  Per Dr. Sabra Heck, pt d/c scheduled for Friday morning 1/20.   New goal(s): to develop effective aftercare plan.   Additional Comments:  Patient and CSW reviewed pt's identified goals and treatment plan. Patient verbalized understanding and agreed to treatment plan. CSW reviewed [facility] "Discharge Process and Patient Involvement" Form. Pt verbalized understanding of information provided and signed form.    Review of initial/current patient goals per problem list:  1. Goal(s): Patient will participate in aftercare plan  Met: Yes   Target date: at discharge  As evidenced by: Patient will participate within aftercare plan AEB aftercare provider and housing plan at discharge being identified.  1/19: Pt given bus pass per his request and plans to return home/follow-up at [facility].  2. Goal (s): Patient will exhibit decreased depressive symptoms and suicidal ideations.  Met: Yes   Target date: at discharge  As evidenced by: Patient will utilize self rating of depression at 3 or below and demonstrate decreased signs of depression or be deemed stable for discharge by  MD.  1/19: Pt rates depression as "none". Denies SI/HI/AVH and is requesting immediate discharge.   3. Goal(s): Patient will demonstrate decreased signs of withdrawal due to substance abuse  FFM:[id number] for d/c tomorrow per Dr. Sabra Heck.   Target date:at discharge   As evidenced by: Patient will produce a CIWA/COWS score of 0, have stable vitals signs, and no symptoms of withdrawal.  1/19: Pt reports mild withdrawal symptoms with CIWA score of 6 and high BP. Per MD, pt is stable for Friday morning d/c.   Attendees: Patient:   10/24/2015 8:21 AM   Family:   10/24/2015 8:21 AM   Physician:  Dr. Carlton Adam, MD 10/24/2015 8:21 AM   Nursing:   Sherrine Maples RN 10/24/2015 8:21 AM   Clinical Social Worker: Maxie Better, LCSW 10/24/2015 8:21 AM   Clinical Social Worker: Erasmo Downer Drinkard LCSWA; Peri Maris LCSWA 10/24/2015 8:21 AM   Other:  Gerline Legacy Nurse Case Manager 10/24/2015 8:21 AM   Other:  Agustina Caroli NP  10/24/2015 8:21 AM   Other:  10/24/2015 8:21 AM   Other:  10/24/2015 8:21 AM   Other:  10/24/2015 8:21 AM   Other:  10/24/2015 8:21 AM    10/24/2015 8:21 AM    10/24/2015 8:21 AM    10/24/2015 8:21 AM    10/24/2015 8:21 AM    Scribe for Treatment Team:   Maxie Better, LCSW 10/24/2015 8:21 AM

## 2015-10-24 NOTE — Progress Notes (Signed)
D: Pt continues to be very flat and depressed on the unit today. Pt also continues to be very isolative. Pt reported this morning that he was ready for discharge and constantly kept asking when he should leave . Pt reported that his depression was a 0, his hopelessness was a 0, and that his anxiety was a 0. Pt reported being negative SI/HI, no AH/VH noted. A: 15 min checks continued for patient safety. R: Pts safety maintained.

## 2015-10-24 NOTE — BHH Group Notes (Signed)
[facility] Group Notes:  (Nursing/MHT/Case Management/Adjunct)  Date:  10/24/2015  Time:  2:09 PM   Type of Therapy:  Psychoeducational Skills  Participation Level:  Did Not Attend  Participation Quality:  DID NOT ATTEND  Affect:  DID NOT ATTEND  Cognitive:  DID NOT ATTEND  Insight:  None  Engagement in Group:  DID NOT ATTEND  Modes of Intervention:  DID NOT ATTEND  Summary of Progress/Problems: Pt did not attend patient self inventory group.  Wolfgang [place] 10/24/2015, 2:09 PM

## 2015-10-24 NOTE — Progress Notes (Signed)
Pt did not attend wrap up group meeting.

## 2015-10-24 NOTE — H&P (Signed)
Psychiatric Admission Assessment Adult  Patient Identification: [name] MRN:  [id number] Date of Evaluation:  10/24/2015 Chief Complaint:  Depressive Disorder Principal Diagnosis: <principal problem not specified> Diagnosis:   Patient Active Problem List   Diagnosis Date Noted  . Alcohol dependence ([place]) [F10.20] 10/23/2015  . Vitamin D deficiency [E55.9] 04/15/2015  . ETOH abuse [F10.10] 04/12/2015  . Screening for HIV (human immunodeficiency virus) [Z11.4] 04/12/2015  . 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 Lipoma of flank [D17.1] 04/12/2015  . Seizures ([place]) [R56.9] 04/12/2015  . Persistent headaches [R51] 04/12/2015  . Horizontal nystagmus [H55.09] 04/12/2015  . Hypocalcemia [E83.51] 04/12/2015   History of Present Illness:: 55 Y/O male who states he got stressed out drank couple of drinks. States his "old lady" left states he was not making enough money she was getting a check so she left. They were together 3 years. She had 2 special needs kids and an older son. States he was drinking 3-4 40 ounces a day now cut it down to one. Has been drinking over 20 years. He today denies SI plans or intent. States he needs to be D/C as soon as possible as he needs to move in the morning His initial assessment was as follows: [name] is an 55 y.o.single male who has a hx of alcohol intoxication. Pt was found on the side of the road and called 911 tonight and was brought in by the PD voluntarily. Pt was seen in the ED on 1/13 and 1/16 for alcohol intoxication and appears intoxicated again tonight. Pt's BAL and UDS are incomplete at this time. Pt sts he consumed "a lot" of alcohol tonight before coming to the ED. Pt sts that he usually consumes about 2 to 3 - 40 oz beers each time he drinks alcohol and he sts he drinks alcohol about 3-4 times a week. Pt sts he also smokes marijuana about 1 x per month and smokes cigarettes daily. Pt sts he is upset over his wife leaving him  stating "she broke my heart." Pt sts that he has been having SI with a plan to jump from a bridge to kill himself. Pt denies HI, SHI and AVH. Pt denies a hx of physical or verbal aggression and appears calm although irritable at this time. Pt sts he has no family or other individuals that support him. Pt sts he lives alone. Associated Signs/Symptoms: Depression Symptoms: " he is over it now. " (Hypo) Manic Symptoms:  denies Anxiety Symptoms:  Excessive Worry, Psychotic Symptoms:  denies PTSD Symptoms: Negative Total Time spent with patient: 45 minutes  Past Psychiatric History:   Risk to Self: Is patient at risk for suicide?: Yes Risk to Others:  no  Prior Inpatient Therapy:  Denies Prior Outpatient Therapy:  Denies  Alcohol Screening: 1. How often do you have a drink containing alcohol?: 4 or more times a week 2. How many drinks containing alcohol do you have on a typical day when you are drinking?: 10 or more 3. How often do you have six or more drinks on one occasion?: Daily or almost daily Preliminary Score: 8 4. How often during the last year have you found that you were not able to stop drinking once you had started?: Daily or almost daily 5. How often during the last year have you failed to do what was normally expected from you becasue of drinking?: Monthly 7. How often during the last year have you had a feeling of guilt of  remorse after drinking?: Never 8. How often during the last year have you been unable to remember what happened the night before because you had been drinking?: Monthly 9. Have you or someone else been injured as a result of your drinking?: Yes, but not in the last year 10. Has a relative or friend or a doctor or another health worker been concerned about your drinking or suggested you cut down?: Yes, during the last year Alcohol Use Disorder Identification Test Final Score (AUDIT): 26 Brief Intervention: Yes Substance Abuse History in the last 12 months:   Yes.   Consequences of Substance Abuse: Negative Previous Psychotropic Medications: No  Psychological Evaluations: No  Past Medical History:  Past Medical History  Diagnosis Date  . Seizures ([place])   . Pneumonia   . Alcohol abuse     Past Surgical History  Procedure Laterality Date  . Brain tumor excision     Family History:  Family History  Problem Relation Age of Onset  . Alcoholism Mother   . Alcoholism Father    Family Psychiatric  History: mother and father had alcohol dependence  Social History:  History  Alcohol Use  . 7.2 oz/week  . 12 Cans of beer per week    Comment: daily     History  Drug Use  . Yes  . Special: Marijuana    Comment: occ    Social History   Social History  . Marital Status: Single    Spouse Name: N/A  . Number of Children: N/A  . Years of Education: N/A   Social History Main Topics  . Smoking status: Current Some Day Smoker -- 1.00 packs/day    Types: Cigarettes  . Smokeless tobacco: None  . Alcohol Use: 7.2 oz/week    12 Cans of beer per week     Comment: daily  . Drug Use: Yes    Special: Marijuana     Comment: occ  . Sexual Activity: Not Asked   Other Topics Concern  . None   Social History Narrative  moving out his house to an apartment. 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 Single has 2 boys 30 and 53 Y/O  Additional Social History:                         Allergies:   Allergies  Allergen Reactions  . Borax Anaphylaxis   Lab Results:  Results for orders placed or performed during the hospital encounter of 10/23/15 (from the past 48 hour(s))  CBC with Differential     Status: Abnormal   Collection Time: 10/23/15  3:27 AM  Result Value Ref Range   WBC 5.6 4.0 - 10.5 K/uL   RBC 4.33 4.22 - 5.81 MIL/uL   Hemoglobin 13.1 13.0 - 17.0 g/dL   HCT 40.3 39.0 - 52.0 %   MCV 93.1 78.0 - 100.0 fL   MCH 30.3 26.0 - 34.0 pg   MCHC 32.5 30.0 - 36.0 g/dL    RDW 14.2 11.5 - 15.5 %   Platelets 135 (L) 150 - 400 K/uL   Neutrophils Relative % 51 %   Neutro Abs 2.9 1.7 - 7.7 K/uL   Lymphocytes Relative 36 %   Lymphs Abs 2.0 0.7 - 4.0 K/uL   Monocytes Relative 7 %   Monocytes Absolute 0.4 0.1 - 1.0 K/uL   Eosinophils Relative 5 %   Eosinophils Absolute  0.3 0.0 - 0.7 K/uL   Basophils Relative 1 %   Basophils Absolute 0.1 0.0 - 0.1 K/uL  Ethanol     Status: Abnormal   Collection Time: 10/23/15  3:27 AM  Result Value Ref Range   Alcohol, Ethyl (B) 162 (H) <5 mg/dL    Comment:        LOWEST DETECTABLE LIMIT FOR SERUM ALCOHOL IS 5 mg/dL FOR MEDICAL PURPOSES ONLY   Comprehensive metabolic panel     Status: Abnormal   Collection Time: 10/23/15  3:27 AM  Result Value Ref Range   Sodium 142 135 - 145 mmol/L   Potassium 3.6 3.5 - 5.1 mmol/L   Chloride 106 101 - 111 mmol/L   CO2 21 (L) 22 - 32 mmol/L   Glucose, Bld 95 65 - 99 mg/dL   BUN 8 6 - 20 mg/dL   Creatinine, Ser 0.86 0.61 - 1.24 mg/dL   Calcium 8.4 (L) 8.9 - 10.3 mg/dL   Total Protein 6.8 6.5 - 8.1 g/dL   Albumin 3.7 3.5 - 5.0 g/dL   AST 38 15 - 41 U/L   ALT 23 17 - 63 U/L   Alkaline Phosphatase 79 38 - 126 U/L   Total Bilirubin 0.5 0.3 - 1.2 mg/dL   GFR calc non Af Amer >60 >60 mL/min   GFR calc Af Amer >60 >60 mL/min    Comment: (NOTE) The eGFR has been calculated using the CKD EPI equation. This calculation has not been validated in all clinical situations. eGFR's persistently <60 mL/min signify possible Chronic Kidney Disease.    Anion gap 15 5 - 15  Urine rapid drug screen (hosp performed)     Status: None   Collection Time: 10/23/15  5:10 AM  Result Value Ref Range   Opiates NONE DETECTED NONE DETECTED   Cocaine NONE DETECTED NONE DETECTED   Benzodiazepines NONE DETECTED NONE DETECTED   Amphetamines NONE DETECTED NONE DETECTED   Tetrahydrocannabinol NONE DETECTED NONE DETECTED   Barbiturates NONE DETECTED NONE DETECTED    Comment:        DRUG SCREEN FOR MEDICAL  PURPOSES ONLY.  IF CONFIRMATION IS NEEDED FOR ANY PURPOSE, NOTIFY LAB WITHIN 5 DAYS.        LOWEST DETECTABLE LIMITS FOR URINE DRUG SCREEN Drug Class       Cutoff (ng/mL) Amphetamine      1000 Barbiturate      200 Benzodiazepine   323 Tricyclics       557 Opiates          300 Cocaine          300 THC              50     Metabolic Disorder Labs:  No results found for: HGBA1C, MPG No results found for: PROLACTIN Lab Results  Component Value Date   CHOL 161 04/12/2015   TRIG 97 04/12/2015   HDL 79 04/12/2015   CHOLHDL 2.0 04/12/2015   VLDL 19 04/12/2015   LDLCALC 63 04/12/2015    Current Medications: Current Facility-Administered Medications  Medication Dose Route Frequency Provider Last Rate Last Dose  . acetaminophen (TYLENOL) tablet 650 mg  650 mg Oral Q4H PRN Delfin Gant, NP      . alum & mag hydroxide-simeth (MAALOX/MYLANTA) 200-200-20 MG/5ML suspension 30 mL  30 mL Oral Q4H PRN Derrill Center, NP      . hydrOXYzine (ATARAX/VISTARIL) tablet 25 mg  25 mg Oral Q6H PRN Kerrie Buffalo,  NP      . levETIRAcetam (KEPPRA) tablet 500 mg  500 mg Oral BID Nicholaus Bloom, MD      . loperamide (IMODIUM) capsule 2-4 mg  2-4 mg Oral PRN Kerrie Buffalo, NP      . LORazepam (ATIVAN) tablet 1 mg  1 mg Oral Q6H PRN Kerrie Buffalo, NP      . LORazepam (ATIVAN) tablet 1 mg  1 mg Oral QID Kerrie Buffalo, NP   1 mg at 10/24/15 0837   Followed by  . [START ON 10/25/2015] LORazepam (ATIVAN) tablet 1 mg  1 mg Oral TID Kerrie Buffalo, NP       Followed by  . [START ON 10/26/2015] LORazepam (ATIVAN) tablet 1 mg  1 mg Oral BID Kerrie Buffalo, NP       Followed by  . [START ON 10/27/2015] LORazepam (ATIVAN) tablet 1 mg  1 mg Oral Daily Kerrie Buffalo, NP      . magnesium hydroxide (MILK OF MAGNESIA) suspension 30 mL  30 mL Oral Daily PRN Derrill Center, NP      . ondansetron (ZOFRAN) tablet 4 mg  4 mg Oral Q8H PRN Delfin Gant, NP      . thiamine (VITAMIN B-1) tablet 100 mg  100 mg Oral  Daily Kerrie Buffalo, NP   100 mg at 10/24/15 1025   PTA Medications: Prescriptions prior to admission  Medication Sig Dispense Refill Last Dose  . fluticasone (FLONASE) 50 MCG/ACT nasal spray Place 2 sprays into both nostrils daily. 16 g 3 Past Month at Unknown time  . folic acid (FOLVITE) 1 MG tablet Take 1 tablet (1 mg total) by mouth daily. 30 tablet 2 Past Month at Unknown time  . levETIRAcetam (KEPPRA) 500 MG tablet Take 1 tablet (500 mg total) by mouth 2 (two) times daily. 60 tablet 0 10/22/2015 at Unknown time  . phenytoin (DILANTIN) 100 MG ER capsule Take 100 mg by mouth 3 (three) times daily.  1 Past Month at Unknown time  . ranitidine (ZANTAC) 150 MG tablet Take 1 tablet (150 mg total) by mouth 2 (two) times daily. 60 tablet 2 Past Month at Unknown time  . thiamine (VITAMIN B-1) 100 MG tablet Take 1 tablet (100 mg total) by mouth daily. 30 tablet 2 Past Month at Unknown time  . Vitamin D, Ergocalciferol, (DRISDOL) 50000 UNITS CAPS capsule Take 1 capsule (50,000 Units total) by mouth every 7 (seven) days. For 12 weeks 4 capsule 2 Past Month at Unknown time    Musculoskeletal: Strength & Muscle Tone: within normal limits Gait & Station: normal Patient leans: normal  Psychiatric Specialty Exam: Physical Exam  Review of Systems  Constitutional: Negative.   HENT: Negative.        Tumor inside his nose  Eyes: Negative.   Respiratory:       Pack a day  Cardiovascular: Negative.   Gastrointestinal: Positive for nausea.  Genitourinary: Negative.   Musculoskeletal: Positive for joint pain.  Skin: Negative.   Neurological: Negative.   Endo/Heme/Allergies: Negative.   Psychiatric/Behavioral: Positive for substance abuse. The patient is nervous/anxious and has insomnia.     Blood pressure 168/103, pulse 72, temperature 98.4 F (36.9 C), temperature source Oral, resp. rate 20, height 5' 5"  (1.651 m), weight 72.576 kg (160 lb), SpO2 98 %.Body mass index is 26.63 kg/(m^2).  General  Appearance: Fairly Groomed  Engineer, water::  Fair  Speech:  Clear and Coherent  Volume:  Decreased  Mood:  Anxious and  worried  Affect:  Restricted  Thought Process:  Coherent and Goal Directed  Orientation:  Full (Time, Place, and Person)  Thought Content:  symptoms events worries concerns  Suicidal Thoughts:  No  Homicidal Thoughts:  No  Memory:  Immediate;   Fair Recent;   Fair Remote;   Fair  Judgement:  Fair  Insight:  Shallow  Psychomotor Activity:  Restlessness  Concentration:  Fair  Recall:  [employer] of Knowledge:Fair  Language: Fair  Akathisia:  No  Handed:  Right  AIMS (if indicated):     Assets:  Desire for Improvement Housing Vocational/Educational  ADL's:  Intact  Cognition: WNL  Sleep:  Number of Hours: 6.25     Treatment Plan Summary: Daily contact with patient to assess and evaluate symptoms and progress in treatment and Medication management Supportive approach/coping skills Alcohol dependence; Ativan detox protocol/work a relapse prevention plan Reassess for co morbidities and the need for other mood stabilizers. Work with CBT/mindfulness Will at least work on his detox trough the day and D/C early morning so he can carry out his plan of moving Observation Level/Precautions:  15 minute checks  Laboratory:  As per the ED  Psychotherapy:  Individual/group  Medications:  Will detox with Ativan resume his Keppra and reassess for other psychotropics  Consultations:    Discharge Concerns:    Estimated LOS: Mr. Kumari wants to be be D/C as he has to move his things out to his new apartment early Morning  Other:     I certify that inpatient services furnished can reasonably be expected to improve the patient's condition.   [place] A 1/19/20179:47 AM

## 2015-10-24 NOTE — BHH Group Notes (Signed)
[place] LCSW Group Therapy  10/24/2015 12:51 PM  Type of Therapy:  Group Therapy  Participation Level:  Did Not Attend-pt demanding to discharge. Asked to step out of group if he needed to calm down or speak with the MD.   Modes of Intervention:  Confrontation, Discussion, Education, Exploration, Problem-solving, Rapport Building, Socialization and Support  Summary of Progress/Problems:  Finding Balance in Life. Today's group focused on defining balance in one's own words, identifying things that can knock one off balance, and exploring healthy ways to maintain balance in life. Group members were asked to provide an example of a time when they felt off balance, describe how they handled that situation,and process healthier ways to regain balance in the future. Group members were asked to share the most important tool for maintaining balance that they learned while at [facility] and how they plan to apply this method after discharge.   Smart, Christopher Branscom LCSW 10/24/2015, 12:51 PM

## 2015-10-24 NOTE — Plan of Care (Signed)
Problem: Consults Goal: Suicide Risk Patient Education (See Patient Education module for education specifics)  Outcome: Progressing Pt instructed to inform staff if having SI/HI. Pt agreed and verbally contracted for safety.

## 2015-10-24 NOTE — Progress Notes (Signed)
Pt walked out of the unit to the main hallway by following a staff from behind. Pt was brought back, safety ensured by moving pt to [address] hall. Pt maintained on Q 15 min checks for safety purposes, will continue to monitor.

## 2015-10-25 ENCOUNTER — Encounter (HOSPITAL_COMMUNITY): Payer: Self-pay | Admitting: Psychiatry

## 2015-10-25 DIAGNOSIS — F10280 Alcohol dependence with alcohol-induced anxiety disorder: Secondary | ICD-10-CM

## 2015-10-25 DIAGNOSIS — F10939 Alcohol use, unspecified with withdrawal, unspecified: Secondary | ICD-10-CM | POA: Clinically undetermined

## 2015-10-25 DIAGNOSIS — F10232 Alcohol dependence with withdrawal with perceptual disturbance: Secondary | ICD-10-CM

## 2015-10-25 DIAGNOSIS — F1020 Alcohol dependence, uncomplicated: Secondary | ICD-10-CM | POA: Diagnosis present

## 2015-10-25 DIAGNOSIS — F332 Major depressive disorder, recurrent severe without psychotic features: Secondary | ICD-10-CM | POA: Clinically undetermined

## 2015-10-25 DIAGNOSIS — F10239 Alcohol dependence with withdrawal, unspecified: Secondary | ICD-10-CM | POA: Clinically undetermined

## 2015-10-25 MED ORDER — OLANZAPINE 5 MG PO TBDP
5.0000 mg | ORAL_TABLET | Freq: Four times a day (QID) | ORAL | Status: DC | PRN
  Administered 2015-10-25: 5 mg via ORAL
  Filled 2015-10-25: qty 1

## 2015-10-25 NOTE — Progress Notes (Signed)
DAR NOTE: Patient presents with anxious mood and affect.  Denies pain, visual and auditory hallucinations.  Patient continues to request and demand for discharge.  Patient reminded about his treatment plan and that the providers are aware of his request.  Medication given as prescribed.  Routine safety checks maintained.  Patient attended group and participated.  Minimal interaction with staff and peers. Patient was found in another patient's room with a broken night stand.  Patient states, I'm going to break the window so I can leave this place."  Patient redirected and offered Ativan and Zyprexa with good effect.  Patient safe on the unit with  No further issues or episode.

## 2015-10-25 NOTE — BHH Group Notes (Signed)
[facility] LCSW Aftercare Discharge Planning Group Note   10/25/2015 3:48 PM  Participation Quality:  Active  Mood/Affect:  Appropriate  Depression Rating:  Denies   Anxiety Rating: Denies   Thoughts of Suicide:  No Will you contract for safety?   NA  Current AVH:  No  Plan for Discharge/Comments: Pt currently denies all symptoms. Pt was irritated about still being in the hospital because he was told that he would be able to discharge Friday AM. CSW explained that he wouldn't be leaving today but appreciated the pt's patience in the situation. Pt seemed to respond well to the feedback. Pt will return home and follow-up outpt with [facility].  Transportation Means: Bus Pass  Supports:  Georga Kaufmann

## 2015-10-25 NOTE — Progress Notes (Signed)
[facility] MD Progress Note  10/25/2015 10:29 AM [name]  MRN:  [id number] Subjective:  " I am fine , I want to go home.'  Objective:.[name] is a 55 y.o.single male who has a hx of alcohol use disorder , seizure disorder , depression , presented to [facility] with SI with plan to jump off a bridge.  Per initial notes in EHR ' . Pt was found on the side of the road and called 911 tonight and was brought in by the PD voluntarily. Pt was seen in the ED on 1/13 and 1/16 for alcohol intoxication and appeared intoxicated again tonight. Pt's BAL and UDS are incomplete at this time. Pt sts he consumed "a lot" of alcohol tonight before coming to the ED. Pt sts that he usually consumes about 2 to 3 - 40 oz beers each time he drinks alcohol and he sts he drinks alcohol about 3-4 times a week. Pt sts he also smokes marijuana about 1 x per month and smokes cigarettes daily. Pt sts he is upset over his wife leaving him stating "she broke my heart." Pt sts that he has been having SI with a plan to jump from a bridge to kill himself. Pt denies a hx of physical or verbal aggression and appears calm although irritable at this time. Pt sts he has no family or other individuals that support him. Pt sts he lives alone. Pt sts he has no hx of IP admissions or OPT. Pt denies physical, emotional/verbal and sexual abuse. Pt denies any hx of suicide or MH issues in his family to his knowledge. Symptoms of depression include deep sadness, fatigue, excessive guilt, decreased self esteem, tearfulness & crying spells, self isolation, lack of motivation for activities and pleasure, irritability, negative outlook, difficulty thinking & concentrating, feeling helpless and hopeless, sleep and eating disturbances. Pt denies symptoms of anxiety. "    Patient seen today and chart reviewed.Discussed patient with treatment team. Pt today seems focussed on discharge . Pt is limited historian, appears paranoid, guarded. Pt  appears irritable on and off and required a lot of redirection during evaluation. Per staff - pt attempted to elope yesterday and hence was moved to 500 H. Pt continues to remain irritable , paranoid and anxious - requires redirection throughout the day.  Pt tolerating Ativan well, is on CIWA protocol.Reviewed CIWA/VS.         Principal Problem: MDD (major depressive disorder), recurrent episode, severe ([place]) Diagnosis:   Patient Active Problem List   Diagnosis Date Noted  . Alcohol use disorder, severe, dependence ([place]) [F10.20] 10/25/2015  . 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 MDD (major depressive disorder), recurrent episode, severe ([place]) [F33.2] 10/25/2015  . Vitamin D deficiency [E55.9] 04/15/2015  . Screening for HIV (human immunodeficiency virus) [Z11.4] 04/12/2015  . 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 Lipoma of flank [D17.1] 04/12/2015  . Seizures ([place]) [R56.9] 04/12/2015  . Persistent headaches [R51] 04/12/2015  . Horizontal nystagmus [H55.09] 04/12/2015  . Hypocalcemia [E83.51] 04/12/2015   Total Time spent with patient: 30 minutes  Past Psychiatric History: As per H&p  Past Medical History:  Past Medical History  Diagnosis Date  . Seizures ([place])   . Pneumonia   . Alcohol abuse     Past Surgical History  Procedure Laterality Date  . Brain tumor excision     Family History:  Family History  Problem Relation Age of Onset  . Alcoholism Mother   .  Alcoholism Father    Family Psychiatric  History: Pt denies Social History: Pt is single , lives by self in [place], denies legal issues. History  Alcohol Use  . 7.2 oz/week  . 12 Cans of beer per week    Comment: daily     History  Drug Use  . Yes  . Special: Marijuana    Comment: occ    Social History   Social History  . Marital Status: Single    Spouse Name: N/A  . Number of Children: N/A  . Years of Education: N/A   Social  History Main Topics  . Smoking status: Current Some Day Smoker -- 1.00 packs/day    Types: Cigarettes  . Smokeless tobacco: None  . Alcohol Use: 7.2 oz/week    12 Cans of beer per week     Comment: daily  . Drug Use: Yes    Special: Marijuana     Comment: occ  . Sexual Activity: Not Asked   Other Topics Concern  . None   Social History Narrative   Additional Social History:                         Sleep: Poor as observed  Appetite:  Fair  Current Medications: Current Facility-Administered Medications  Medication Dose Route Frequency Provider Last Rate Last Dose  . acetaminophen (TYLENOL) tablet 650 mg  650 mg Oral Q4H PRN Delfin Gant, NP   650 mg at 10/24/15 1151  . alum & mag hydroxide-simeth (MAALOX/MYLANTA) 200-200-20 MG/5ML suspension 30 mL  30 mL Oral Q4H PRN Derrill Center, NP      . hydrOXYzine (ATARAX/VISTARIL) tablet 25 mg  25 mg Oral Q6H PRN Kerrie Buffalo, NP      . levETIRAcetam (KEPPRA) tablet 500 mg  500 mg Oral BID Nicholaus Bloom, MD   500 mg at 10/25/15 [id number]  . loperamide (IMODIUM) capsule 2-4 mg  2-4 mg Oral PRN Kerrie Buffalo, NP      . LORazepam (ATIVAN) tablet 1 mg  1 mg Oral TID Kerrie Buffalo, NP   1 mg at 10/25/15 0842   Followed by  . [START ON 10/26/2015] LORazepam (ATIVAN) tablet 1 mg  1 mg Oral BID Kerrie Buffalo, NP       Followed by  . [START ON 10/27/2015] LORazepam (ATIVAN) tablet 1 mg  1 mg Oral Daily Kerrie Buffalo, NP      . LORazepam (ATIVAN) tablet 1 mg  1 mg Oral Q4H PRN Nicholaus Bloom, MD      . magnesium hydroxide (MILK OF MAGNESIA) suspension 30 mL  30 mL Oral Daily PRN Derrill Center, NP      . OLANZapine zydis (ZYPREXA) disintegrating tablet 5 mg  5 mg Oral Q6H PRN Ursula Alert, MD      . ondansetron (ZOFRAN) tablet 4 mg  4 mg Oral Q8H PRN Delfin Gant, NP      . thiamine (VITAMIN B-1) tablet 100 mg  100 mg Oral Daily Kerrie Buffalo, NP   100 mg at 10/25/15 [id number]    Lab Results:  Results for orders placed or  performed during the hospital encounter of 10/23/15 (from the past 48 hour(s))  Magnesium     Status: None   Collection Time: 10/24/15  6:28 PM  Result Value Ref Range   Magnesium 2.0 1.7 - 2.4 mg/dL    Comment: Performed at [facility]  Ammonia  Status: None   Collection Time: 10/24/15  6:41 PM  Result Value Ref Range   Ammonia 29 9 - 35 umol/L    Comment: Performed at [facility]    Physical Findings: AIMS: Facial and Oral Movements Muscles of Facial Expression: None, normal Lips and Perioral Area: None, normal Jaw: None, normal Tongue: None, normal,Extremity Movements Upper (arms, wrists, hands, fingers): None, normal Lower (legs, knees, ankles, toes): None, normal, Trunk Movements Neck, shoulders, hips: None, normal, Overall Severity Severity of abnormal movements (highest score from questions above): None, normal Incapacitation due to abnormal movements: None, normal Patient's awareness of abnormal movements (rate only patient's report): No Awareness, Dental Status Current problems with teeth and/or dentures?: Yes Does patient usually wear dentures?: No  CIWA:  CIWA-Ar Total: 3 COWS:     Musculoskeletal: Strength & Muscle Tone: within normal limits Gait & Station: normal Patient leans: N/A  Psychiatric Specialty Exam: Review of Systems  Psychiatric/Behavioral: Negative.   All other systems reviewed and are negative.   Blood pressure 145/97, pulse 87, temperature 97.2 F (36.2 C), temperature source Oral, resp. rate 20, height 5\' 5"  (1.651 m), weight 72.576 kg (160 lb), SpO2 98 %.Body mass index is 26.63 kg/(m^2).  General Appearance: Casual  Eye Contact::  Minimal  Speech:  Normal Rate  Volume:  Increased  Mood:  Irritable  Affect:  Labile  Thought Process:  Linear  Orientation:  Full (Time, Place, and Person)  Thought Content:  Paranoid Ideation and Rumination  Suicidal Thoughts:  did have a plan to jump off a bridge,  currently denies it  Homicidal Thoughts:  No  Memory:  Immediate;   Fair Recent;   Fair Remote;   Fair  Judgement:  Impaired  Insight:  Shallow  Psychomotor Activity:  Increased  Concentration:  Poor  Recall:  [place]: Fair  Akathisia:  No  Handed:  Right  AIMS (if indicated):     Assets:  Others:  access to health care   ADL's:  Intact  Cognition: WNL  Sleep:  Number of Hours: 3.75   Treatment Plan Summary:.[name] is a 55 y.o.single male who has a hx of alcohol use disorder , seizure disorder , depression , presented to [facility] with SI with plan to jump off a bridge. Pt currently on alcohol withdrawal protocol , continues to be irritable , labile , impulsive , uncooperative. Will continue treatment. Daily contact with patient to assess and evaluate symptoms and progress in treatment and Medication management  Reviewed past medical records,treatment plan. Reviewed H&P done by Dr.Lugo. Will continue CIWA/Ativan protocol for alcohol withdrawal sx. Discussed starting a trial of an SSRI for affective sx- pt refused. Will continue Zyprexa 5 mg po prn for severe anxiety/agitation. Will continue Keppra 500 mg po bid for seizure do. Pt also with hx of TBI in the past - unknown if this is causing some his impulsivity /mood sx. Will continue to monitor vitals ,medication compliance and treatment side effects while patient is here.  Will monitor for medical issues as well as call consult as needed.  Reviewed labs ,will order TSH. CSW will start working on disposition.  Patient to participate in therapeutic milieu .           Ryleigh Buenger MD 10/25/2015, 10:29 AM

## 2015-10-25 NOTE — BHH Group Notes (Signed)
[facility] LCSW Group Therapy   10/25/2015 2:18 PM  Type of Therapy: Group Therapy  Participation Level:  Active  Participation Quality:  Attentive  Affect:  Flat  Cognitive:  Oriented  Insight:  Limited  Engagement in Therapy:  Engaged  Modes of Intervention:  Discussion and Socialization  Summary of Progress/Problems: Chaplain was here to lead a group on themes of hope and/or courage.  Pt stayed for the entire group but did doze off a few times. Pt said his purpose was to get out of the hospital. He spoke about looking forward to leaving so that he could go home and smoke.  Was offered referral to rehab-14 or 28 days.  Declined.  Georga Kaufmann 10/25/2015 2:18 PM

## 2015-10-25 NOTE — Progress Notes (Signed)
Psychoeducational Group Note  Date:  10/25/2015 Time:  2052  Group Topic/Focus:  Wrap-Up Group:   The focus of this group is to help patients review their daily goal of treatment and discuss progress on daily workbooks.  Participation Level: Did Not Attend  Participation Quality:  Not Applicable  Affect:  Not Applicable  Cognitive:  Not Applicable  Insight:  Not Applicable  Engagement in Group: Not Applicable  Additional Comments:  The patient did not attend group this evening since he was asleep in his bed.   Archie Balboa S 10/25/2015, 8:52 PM

## 2015-10-25 NOTE — Progress Notes (Signed)
D: Pt is paranoid and disoriented to time, place and situation. Pt is an elopement risk; he already has his things packed, continually tries to open the locked doors. Pt could also be loud and irritable. Pt denies pain, SI, HI, AVH, depression and anxiety. He states, "I am fine, I just need to leave now." Pt remained nonviolent. A: Medications offered as prescribed.  Support, encouragement, and safe environment provided.  15-minute safety checks continue. R: Pt was med compliant.  Pt did not attend wrap-up group. Safety checks continue.

## 2015-10-25 NOTE — BHH Counselor (Signed)
Adult Comprehensive Assessment  Patient ID: Christopher STAUDE, male   DOB: May 06, 1961, 55 y.o.   MRN: [id number]  Information Source:    Current Stressors:  Educational / Learning stressors: None reported  Employment / Job issues: None reported  Family Relationships: Conflictual relationship with siblings and relationship issues with wife Museum/gallery curator / Lack of resources (include bankruptcy): Lmited income  Housing / Lack of housing: None reported  Physical health (include injuries & life threatening diseases): None reported  Social relationships: None reported  Substance abuse: Pt denies  Bereavement / Loss: None reported   Living/Environment/Situation:  Living Arrangements: Spouse/significant other Living conditions (as described by patient or guardian): Pt lives with his wife and his wife's children  How long has patient lived in current situation?: About 3 years   Family History:  Marital status: Married Number of Years Married: 2 What types of issues is patient dealing with in the relationship?: Financial issues  Does patient have children?: Yes How many children?: 2 (sons) How is patient's relationship with their children?: Pt has no contact with his children but is close with his wife's children  Childhood History:  By whom was/is the patient raised?: Mother Description of patient's relationship with caregiver when they were a child: "Tough" Patient's description of current relationship with people who raised him/her: Mom is deceased. pt stated that the relationship was tough the whole time that she was living  How were you disciplined when you got in trouble as a child/adolescent?: Spankings  Does patient have siblings?: Yes Number of Siblings: 8 Description of patient's current relationship with siblings: No contact Did patient suffer any verbal/emotional/physical/sexual abuse as a child?: No Did patient suffer from severe childhood neglect?: No Has patient ever been  sexually abused/assaulted/raped as an adolescent or adult?: No Was the patient ever a victim of a crime or a disaster?: No Witnessed domestic violence?: No Has patient been effected by domestic violence as an adult?: No  Education:  Highest grade of school patient has completed: Completed community college  Currently a Ship broker?: No  Employment/Work Situation:   Employment situation: Employed 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 How long has patient been employed?: 3.5 years  Patient's job has been impacted by current illness: No What is the longest time patient has a held a job?: 5 years Where was the patient employed at that time?: [employer]  Has patient ever been in the [employer]?: No Has patient ever served in combat?: No Did You Receive Any Psychiatric Treatment/Services While in Passenger transport manager?:  (NA) Are There Guns or Other Weapons in [place]?: No Are These Psychologist, educational?:  (NA)  Financial Resources:   Financial resources: Income from employment, Physicist, medical, Medicaid  Alcohol/Substance Abuse:   What has been your use of drugs/alcohol within the last 12 months?: Drinks a 40 of beer every day but does not state that he has a problem If attempted suicide, did drugs/alcohol play a role in this?: No Alcohol/Substance Abuse Treatment Hx: Denies past history Has alcohol/substance abuse ever caused legal problems?: No  Social Support System:   Patient's Community Support System: Good Describe Community Support System: Wife Type of faith/religion: Darrick Meigs How does patient's faith help to cope with current illness?: "Helps me move on"  Leisure/Recreation:   Leisure and Hobbies: Horseshoes   Strengths/Needs:   What things does the patient do well?: Horseshoes  In what areas does patient struggle / problems for patient: tennis   Discharge Plan:  Does patient have access to transportation?: Yes Will patient be returning to same living  situation after discharge?: Yes Currently receiving community mental health services: Yes (From Whom) [facility]) Does patient have financial barriers related to discharge medications?: No (Medicaid)  Summary/Recommendations:     Patient is a 55 year old male with a diagnosis of Major Depressive Disorder. Pt presented to the hospital with suicidal ideation. Pt reports primary trigger(s) for admission was financial issues and a disagreement with his wife. Patient will benefit from crisis stabilization, medication evaluation, group therapy and psycho education in addition to case management for discharge planning. At discharge it is recommended that Pt remain compliant with established discharge plan and continue treatment with [facility].    Georga Kaufmann. 10/25/2015

## 2015-10-26 DIAGNOSIS — F1024 Alcohol dependence with alcohol-induced mood disorder: Secondary | ICD-10-CM

## 2015-10-26 DIAGNOSIS — F332 Major depressive disorder, recurrent severe without psychotic features: Principal | ICD-10-CM

## 2015-10-26 MED ORDER — LEVETIRACETAM 500 MG PO TABS: 500.0000 mg | ORAL_TABLET | Freq: Two times a day (BID) | ORAL | Status: DC

## 2015-10-26 MED ORDER — HYDROXYZINE HCL 25 MG PO TABS: 25.0000 mg | ORAL_TABLET | Freq: Four times a day (QID) | ORAL | Status: DC | PRN

## 2015-10-26 NOTE — Progress Notes (Signed)
  [facility] Adult Case Management Discharge Plan :  Will you be returning to the same living situation after discharge:  Yes,  with wife At discharge, do you have transportation home?: Yes,  will be picked up Do you have the ability to pay for your medications: Yes,  Medicaid  Release of information consent forms completed and in the chart;  Patient's signature needed at discharge.  Patient to Follow up at: Follow-up Information    Follow up with [facility].   Why:  Walk in between 8am-9am Monday through Friday for hospital follow-up/medication management/assessment for counseling services.    Contact information:   [address], [place] [address] Phone: [phone] Fax: [phone]      Next level of care provider has access to [place] and Suicide Prevention discussed: Yes,  with patient and with wife  Have you used any form of tobacco in the last 30 days? (Cigarettes, Smokeless Tobacco, Cigars, and/or Pipes): Yes  Has patient been referred to the Quitline?: Patient refused referral  Patient has been referred for addiction treatment: Pt. refused referral  Lysle Dingwall 10/26/2015, 12:45 PM

## 2015-10-26 NOTE — Progress Notes (Signed)
Patient discharged home with prescriptions. Patient was stable and appreciative at that time. All papers and prescriptions were given and valuables returned. Verbal understanding expressed. Denies SI/HI and A/VH. Patient given opportunity to express concerns and ask questions.

## 2015-10-26 NOTE — BHH Group Notes (Signed)
[facility] Group Notes:  (Nursing/MHT/Case Management/Adjunct)  Date:  10/26/2015  Time:  12:31 PM  Type of Therapy:  Nurse Education  Participation Level:  Active  Participation Quality:  Appropriate and attentive  Affect:  Appropriate  Cognitive:  Alert and Appropriate  Insight:  Appropriate and Good  Engagement in Group:  Engaged  Modes of Intervention:  Activity, Discussion and Education  Summary of Progress/Problems:Summary of Progress/Problems: Topic was on healthy coping skills. Discussed the importance of choosing the right coping skills.  Group encouraged to surround themselves with positive and healthy group/support system when changing to a healthy lifestyle. Patient was receptive and contributed.   Mart Piggs 10/26/2015, 12:31 PM

## 2015-10-26 NOTE — Discharge Summary (Signed)
Physician Discharge Summary Note  Patient:  [name] is an 55 y.o., male MRN:  [id number] DOB:  October 13, 1960 Patient phone:  [phone] (home)  Patient address:   [address] [place] [address],  Total Time spent with patient: 30 minutes  Date of Admission:  10/23/2015 Date of Discharge: 10/26/2015  Reason for Admission:  Depression  Principal Problem: MDD (major depressive disorder), recurrent episode, severe [facility]) Discharge Diagnoses: Patient Active Problem List   Diagnosis Date Noted  . MDD (major depressive disorder), recurrent episode, severe ([place]) [F33.2] 10/25/2015    Priority: High  . Alcohol use disorder, severe, dependence ([place]) [F10.20] 10/25/2015  . 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 Vitamin D deficiency [E55.9] 04/15/2015  . Screening for HIV (human immunodeficiency virus) [Z11.4] 04/12/2015  . 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 Lipoma of flank [D17.1] 04/12/2015  . Seizures ([place]) [R56.9] 04/12/2015  . Persistent headaches [R51] 04/12/2015  . Horizontal nystagmus [H55.09] 04/12/2015  . Hypocalcemia [E83.51] 04/12/2015    Past Psychiatric History:  MDD  Past Medical History:  Past Medical History  Diagnosis Date  . Seizures ([place])   . Pneumonia   . Alcohol abuse     Past Surgical History  Procedure Laterality Date  . Brain tumor excision     Family History:  Family History  Problem Relation Age of Onset  . Alcoholism Mother   . Alcoholism Father    Family Psychiatric  History:  Denies Social History:  History  Alcohol Use  . 7.2 oz/week  . 12 Cans of beer per week    Comment: daily     History  Drug Use  . Yes  . Special: Marijuana    Comment: occ    Social History   Social History  . Marital Status: Single    Spouse Name: N/A  . Number of Children: N/A  . Years of Education: N/A   Social History Main Topics  . Smoking status: Current Some Day  Smoker -- 1.00 packs/day    Types: Cigarettes  . Smokeless tobacco: None  . Alcohol Use: 7.2 oz/week    12 Cans of beer per week     Comment: daily  . Drug Use: Yes    Special: Marijuana     Comment: occ  . Sexual Activity: Not Asked   Other Topics Concern  . None   Social History Narrative    Hospital Course:  [name] is an 55 y.o.single male who has a hx of alcohol intoxication.  Pt was seen in the ED on 1/13 and 1/16 for alcohol intoxication and appears intoxicated again tonight.  Pt sts that he has been having SI with a plan to jump from a bridge to kill himself.    [name] was admitted for MDD (major depressive disorder), recurrent episode, severe ([place]) and crisis management.  He was treated with the following medications as listed below.  [name] was discharged with current medication and was instructed on how to take medications as prescribed; (details listed below under Medication List).  Medical problems were identified and treated as needed.  Home medications were restarted as appropriate.  Improvement was monitored by observation and [name] daily report of symptom reduction.  Emotional and mental status was monitored by daily self-inventory reports completed by [name] and clinical staff.         [name] was evaluated by the treatment  team for stability and plans for continued recovery upon discharge.  [name] motivation was an integral factor for scheduling further treatment.  Employment, transportation, bed availability, health status, family support, and any pending legal issues were also considered during his hospital stay.  He was offered further treatment options upon discharge including but not limited to Residential, Intensive Outpatient, and Outpatient treatment.  [name] will follow up with the services as listed below under Follow Up Information.     Upon completion of this  admission the [name] was both mentally and medically stable for discharge denying suicidal/homicidal ideation, auditory/visual/tactile hallucinations, delusional thoughts and paranoia.     Physical Findings: AIMS: Facial and Oral Movements Muscles of Facial Expression: None, normal Lips and Perioral Area: None, normal Jaw: None, normal Tongue: None, normal,Extremity Movements Upper (arms, wrists, hands, fingers): None, normal Lower (legs, knees, ankles, toes): None, normal, Trunk Movements Neck, shoulders, hips: None, normal, Overall Severity Severity of abnormal movements (highest score from questions above): None, normal Incapacitation due to abnormal movements: None, normal Patient's awareness of abnormal movements (rate only patient's report): No Awareness, Dental Status Current problems with teeth and/or dentures?: Yes Does patient usually wear dentures?: No  CIWA:  CIWA-Ar Total: 2 COWS:     Musculoskeletal: Strength & Muscle Tone: within normal limits Gait & Station: normal Patient leans: N/A  Psychiatric Specialty Exam:  SEE MD SRA Review of Systems  All other systems reviewed and are negative.   Blood pressure 126/95, pulse 92, temperature 98.7 F (37.1 C), temperature source Oral, resp. rate 16, height 5\' 5"  (1.651 m), weight 72.576 kg (160 lb), SpO2 98 %.Body mass index is 26.63 kg/(m^2).  Have you used any form of tobacco in the last 30 days? (Cigarettes, Smokeless Tobacco, Cigars, and/or Pipes): Yes  Has this patient used any form of tobacco in the last 30 days? (Cigarettes, Smokeless Tobacco, Cigars, and/or Pipes) Yes, deferred  Metabolic Disorder Labs:  No results found for: HGBA1C, MPG No results found for: PROLACTIN Lab Results  Component Value Date   CHOL 161 04/12/2015   TRIG 97 04/12/2015   HDL 79 04/12/2015   CHOLHDL 2.0 04/12/2015   VLDL 19 04/12/2015   LDLCALC 63 04/12/2015    See Psychiatric Specialty Exam and Suicide Risk Assessment  completed by Attending Physician prior to discharge.  Discharge destination:  Home  Is patient on multiple antipsychotic therapies at discharge:  No   Has Patient had three or more failed trials of antipsychotic monotherapy by history:  No  Recommended Plan for Multiple Antipsychotic Therapies: NA     Medication List    STOP taking these medications        acetaminophen-codeine 300-30 MG tablet  Commonly known as:  TYLENOL #3     fluticasone 50 MCG/ACT nasal spray  Commonly known as:  FLONASE     folic acid 1 MG tablet  Commonly known as:  FOLVITE     phenytoin 100 MG ER capsule  Commonly known as:  DILANTIN     ranitidine 150 MG tablet  Commonly known as:  ZANTAC     thiamine 100 MG tablet  Commonly known as:  VITAMIN B-1     Vitamin D (Ergocalciferol) 50000 units Caps capsule  Commonly known as:  DRISDOL      TAKE these medications      Indication   hydrOXYzine 25 MG tablet  Commonly known as:  ATARAX/VISTARIL  Take 1 tablet (25 mg total) by  mouth every 6 (six) hours as needed (anxiety/agitation or CIWA < or = 10).   Indication:  Anxiety Neurosis     levETIRAcetam 500 MG tablet  Commonly known as:  KEPPRA  Take 1 tablet (500 mg total) by mouth 2 (two) times daily.   Indication:  mood stabilization           Follow-up Information    Follow up with [facility].   Why:  Walk in between 8am-9am Monday through Friday for hospital follow-up/medication management/assessment for counseling services.    Contact information:   [address] [place] [address] Phone: [phone] Fax: [phone]      Follow-up recommendations:  Activity:  as tol Diet:  as tol  Comments:  1.  Take all your medications as prescribed.              2.  Report any adverse side effects to outpatient provider.                       3.  Patient instructed to not use alcohol or illegal drugs while on prescription medicines.            4.  In the event of worsening symptoms,  instructed patient to call 911, the crisis hotline or go to nearest emergency room for evaluation of symptoms.  Signed: Freda Munro May Agustin AGNP-BC 10/26/2015, 11:04 AM   Patient seen, Suicide Assessment Completed.  Disposition Plan Reviewed

## 2015-10-26 NOTE — Progress Notes (Signed)
Pt has been in bed with eyes closed since the beginning of shift. RR equal and unlabored. No distress noted. Fifteen minute checks continue for patient safety. Pt safe on unit.

## 2015-10-26 NOTE — BHH Group Notes (Signed)
[facility] Group Notes:  (Clinical Social Work)  10/26/2015  9:00-9:45AM  Summary of Progress/Problems:   Today's process group involved patients discussing their feelings related to being hospitalized, as well as how they can use their present feelings to create a goal for the day. The patient expressed his primary feeling about being hospitalized is very angry because "I hate liars and I was told I would be here only a couple of hours, now here it is 12 hours later and I'm still here."  He did calm down and was able to admit that there may be good things about being in the hospital, if one looks for the good things.  Type of Therapy:  Group Therapy - Process  Participation Level:  Active  Participation Quality:  Resistant  Affect:  Irritable  Cognitive:  Appropriate  Insight:  Limited  Engagement in Therapy:  Improving  Modes of Intervention:  Exploration, Discussion  Selmer Dominion, LCSW 10/26/2015, 1:05 PM

## 2015-10-26 NOTE — BHH Suicide Risk Assessment (Signed)
[facility] Discharge Suicide Risk Assessment   Principal Problem: MDD (major depressive disorder), recurrent episode, severe ([place]) Discharge Diagnoses:  Patient Active Problem List   Diagnosis Date Noted  . Alcohol use disorder, severe, dependence ([place]) [F10.20] 10/25/2015  . 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 MDD (major depressive disorder), recurrent episode, severe ([place]) [F33.2] 10/25/2015  . Vitamin D deficiency [E55.9] 04/15/2015  . Screening for HIV (human immunodeficiency virus) [Z11.4] 04/12/2015  . 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 Lipoma of flank [D17.1] 04/12/2015  . Seizures ([place]) [R56.9] 04/12/2015  . Persistent headaches [R51] 04/12/2015  . Horizontal nystagmus [H55.09] 04/12/2015  . Hypocalcemia [E83.51] 04/12/2015    Total Time spent with patient: 30 minutes  Musculoskeletal: Strength & Muscle Tone: within normal limits Gait & Station: normal Patient leans: N/A  Psychiatric Specialty Exam: ROS  Blood pressure 126/95, pulse 92, temperature 98.7 F (37.1 C), temperature source Oral, resp. rate 16, height 5\' 5"  (1.651 m), weight 160 lb (72.576 kg), SpO2 98 %.Body mass index is 26.63 kg/(m^2).  General Appearance: Fairly Groomed  Engineer, water::  Good  Speech:  Normal Rate409  Volume:  Normal  Mood:  states he is feeling better, denies depression  Affect:  Appropriate- at this time not irritable or agitated, smiles at times appropriately  Thought Process:  Linear  Orientation:  Other:  fully alert and attentive   Thought Content:  denies hallucinations, no delusions  Suicidal Thoughts:  No- denies any current suicidal ideations, denies any self injurious ideations   Homicidal Thoughts:  No- denies  Any homicidal ideations, denies any violent ideations at this time  Memory:  Recent and remote grossly intact    Judgement:  Other:   improving   Insight:  Fair  Psychomotor Activity:  Normal   Concentration:  Good  Recall:  Good  Fund of Knowledge:Good  Language: Good  Akathisia:  Negative  Handed:  Right  AIMS (if indicated):     Assets:  Resilience  Sleep:  Number of Hours: 6.75  Cognition: WNL  ADL's:  Improved    Mental Status Per Nursing Assessment::   On Admission:  NA  Demographic Factors:  55 year old male   Loss Factors:  financial issues, recent relationship stressors   Historical Factors: History of alcohol dependence , depression .  Risk Reduction Factors:   Sense of responsibility to family and Employed  Continued Clinical Symptoms:  Today presents calm, pleasant, mood is described as "OK" and denies depression, affect is reactive, no thought disorder noted, denies any current SI, denies any self injurious ideations, no hallucinations, no delusions, not internally preoccupied. Future oriented , plans to go stay with a friend  ( Mr Truett Perna ) and return to work soon.  * I have reviewed with staff- patient had an episode of agitation , threw item in a  Room  yesterday, which was reportedly impulsive reaction to not being able to discharge immediately .  He calmed down readily with medication and redirection. Since then he has been calm, behavior in good control, has attended groups and participated appropriately. No current alcohol withdrawal symptoms, CIWA - 0 today.  Vitals stable . RN has contacted Mr. Dara Lords, with patient's consent, who has confirmed patient is going to be living with him.  * Patient has been focused on being discharged - states that if he does not leave he will lose job and this will cause significant disruption to his finances .  Cognitive Features That Contribute To Risk:  No gross cognitive deficits noted upon discharge. Is alert , attentive, and oriented x 3   Suicide Risk:  Mild:  Suicidal ideation of limited frequency, intensity, duration, and specificity.  There are no identifiable plans, no associated intent, mild  dysphoria and related symptoms, good self-control (both objective and subjective assessment), few other risk factors, and identifiable protective factors, including available and accessible social support.  Follow-up Information    Follow up with [facility].   Why:  Walk in between 8am-9am Monday through Friday for hospital follow-up/medication management/assessment for counseling services.    Contact information:   [address]  [address] Phone: [phone] Fax: [phone]      Plan Of Care/Follow-up recommendations:  Activity:  as tolerated  Diet:  Regular  Tests:  NA Other:  see below  Patient has submitted letter requesting discharge and there are no current grounds for involuntary commitment .  He is planning to follow up as above States he intends to maintain sobriety Agrees to return to ED if depression worsens or any other severe symptoms emerge.  Neita Garnet, MD 10/26/2015, 11:47 AM

## 2015-10-27 ENCOUNTER — Encounter (HOSPITAL_COMMUNITY): Payer: Self-pay

## 2015-10-27 ENCOUNTER — Emergency Department (HOSPITAL_COMMUNITY)
Admission: EM | Admit: 2015-10-27 | Discharge: 2015-10-27 | Disposition: A | Discharge status: Home / Self Care | Payer: Medicaid Other | Attending: Emergency Medicine | Admitting: Emergency Medicine

## 2015-10-27 DIAGNOSIS — N528 Other male erectile dysfunction: Secondary | ICD-10-CM | POA: Insufficient documentation

## 2015-10-27 DIAGNOSIS — F17210 Nicotine dependence, cigarettes, uncomplicated: Secondary | ICD-10-CM | POA: Insufficient documentation

## 2015-10-27 DIAGNOSIS — Z8701 Personal history of pneumonia (recurrent): Secondary | ICD-10-CM | POA: Insufficient documentation

## 2015-10-27 DIAGNOSIS — Z79899 Other long term (current) drug therapy: Secondary | ICD-10-CM | POA: Insufficient documentation

## 2015-10-27 DIAGNOSIS — N529 Male erectile dysfunction, unspecified: Secondary | ICD-10-CM | POA: Diagnosis present

## 2015-10-27 NOTE — Discharge Instructions (Signed)
Erectile Dysfunction  Erectile dysfunction is the inability to get or sustain a good enough erection to have sexual intercourse. Erectile dysfunction may involve:   Inability to get an erection.   Lack of enough hardness to allow penetration.   Loss of the erection before sex is finished.   Premature ejaculation.  CAUSES   Certain drugs, such as:    Pain relievers.    Antihistamines.    Antidepressants.    Blood pressure medicines.    Water pills (diuretics).    Ulcer medicines.    Muscle relaxants.    Illegal drugs.   Excessive drinking.   Psychological causes, such as:    Anxiety.    Depression.    Sadness.    Exhaustion.    Performance fear.    Stress.   Physical causes, such as:    Artery problems. This may include diabetes, smoking, liver disease, or atherosclerosis.    High blood pressure.    Hormonal problems, such as low testosterone.    Obesity.    Nerve problems. This may include back or pelvic injuries, diabetes mellitus, multiple sclerosis, or Parkinson disease.  SYMPTOMS   Inability to get an erection.   Lack of enough hardness to allow penetration.   Loss of the erection before sex is finished.   Premature ejaculation.   Normal erections at some times, but with frequent unsatisfactory episodes.   Orgasms that are not satisfactory in sensation or frequency.   Low sexual satisfaction in either partner because of erection problems.   A curved penis occurring with erection. The curve may cause pain or may be too curved to allow for intercourse.   Never having nighttime erections.  DIAGNOSIS  Your caregiver can often diagnose this condition by:   Performing a physical exam to find other diseases or specific problems with the penis.   Asking you detailed questions about the problem.   Performing blood tests to check for diabetes mellitus or to measure hormone levels.   Performing urine tests to find other underlying health conditions.   Performing an ultrasound exam to check for  scarring.   Performing a test to check blood flow to the penis.   Doing a sleep study at home to measure nighttime erections.  TREATMENT    You may be prescribed medicines by mouth.   You may be given medicine injections into the penis.   You may be prescribed a vacuum pump with a ring.   Penile implant surgery may be performed. You may receive:    An inflatable implant.    A semirigid implant.   Blood vessel surgery may be performed.  HOME CARE INSTRUCTIONS   If you are prescribed oral medicine, you should take the medicine as prescribed. Do not increase the dosage without first discussing it with your physician.   If you are using self-injections, be careful to avoid any veins that are on the surface of the penis. Apply pressure to the injection site for 5 minutes.   If you are using a vacuum pump, make sure you have read the instructions before using it. Discuss any questions with your physician before taking the pump home.  SEEK MEDICAL CARE IF:   You experience pain that is not responsive to the pain medicine you have been prescribed.   You experience nausea or vomiting.  SEEK IMMEDIATE MEDICAL CARE IF:    When taking oral or injectable medications, you experience an erection that lasts longer than 4 hours. If your   physician is unavailable, go to the nearest emergency room for evaluation. An erection that lasts much longer than 4 hours can result in permanent damage to your penis.   You have pain that is severe.   You develop redness, severe pain, or severe swelling of your penis.   You have redness spreading up into your groin or lower abdomen.   You are unable to pass your urine.     This information is not intended to replace advice given to you by your health care provider. Make sure you discuss any questions you have with your health care provider.     Document Released: 09/18/2000 Document Revised: 05/24/2013 Document Reviewed: 02/23/2013  Elsevier Interactive Patient Education 2016  [employer]vier Inc[employer]

## 2015-10-27 NOTE — ED Notes (Signed)
Pt transported from home with c/o erectile dysfunction? 90months. Pt also states he has discharge at times.

## 2015-10-27 NOTE — ED Notes (Signed)
Pt reports that he attempted to have intercourse with his wife this evening and was unable to facilitate an erection. Additionally, pt reports he went to urinate and "a spot of blood came out". Pt denies hx of erectile dysfunction. Pt believe current medication regimen may be the source of his inability to generate an erection. Pt denies penile pain. Pt arrives to [facility] ED A+OX4, speaking in complete sentences, in no acute distress.

## 2015-10-27 NOTE — ED Provider Notes (Signed)
CSN: [id number]     Arrival date & time 10/27/15  0551 History   First MD Initiated Contact with Patient 10/27/15 0602     Chief Complaint  Patient presents with  . Erectile Dysfunction     (Consider location/radiation/quality/duration/timing/severity/associated sxs/prior Treatment) HPI Comments: Patient here complaining of 3 months of erectile dysfunction. States he was watching a horror movie and there was a sex seen which did not arouse him. He attempted to stimulate himself without success. He denies any penile drainage or discharge currently. No recent changes to his medications. No treatment use prior to arrival  Patient is a 56 y.o. male presenting with erectile dysfunction. The history is provided by the patient.  Erectile Dysfunction    Past Medical History  Diagnosis Date  . Seizures ([place])   . Pneumonia   . Alcohol abuse    Past Surgical History  Procedure Laterality Date  . Brain tumor excision     Family History  Problem Relation Age of Onset  . Alcoholism Mother   . Alcoholism Father    Social History  Substance Use Topics  . Smoking status: Current Some Day Smoker -- 1.00 packs/day    Types: Cigarettes  . Smokeless tobacco: None  . Alcohol Use: 7.2 oz/week    12 Cans of beer per week     Comment: daily    Review of Systems  All other systems reviewed and are negative.     Allergies  Borax  Home Medications   Prior to Admission medications   Medication Sig Start Date End Date Taking? Authorizing Provider  hydrOXYzine (ATARAX/VISTARIL) 25 MG tablet Take 1 tablet (25 mg total) by mouth every 6 (six) hours as needed (anxiety/agitation or CIWA < or = 10). 10/26/15   Kerrie Buffalo, NP  levETIRAcetam (KEPPRA) 500 MG tablet Take 1 tablet (500 mg total) by mouth 2 (two) times daily. 10/26/15   Kerrie Buffalo, NP   BP 133/97 mmHg  Temp(Src) 98.3 F (36.8 C) (Oral)  Resp 16  SpO2 99% Physical Exam  Constitutional: He is oriented to person, place, and  time. He appears well-developed and well-nourished.  Non-toxic appearance. No distress.  HENT:  Head: Normocephalic and atraumatic.  Eyes: Conjunctivae, EOM and lids are normal. Pupils are equal, round, and reactive to light.  Neck: Normal range of motion. Neck supple. No tracheal deviation present. No thyroid mass present.  Cardiovascular: Normal rate, regular rhythm and normal heart sounds.  Exam reveals no gallop.   No murmur heard. Pulmonary/Chest: Effort normal and breath sounds normal. No stridor. No respiratory distress. He has no decreased breath sounds. He has no wheezes. He has no rhonchi. He has no rales.  Abdominal: Soft. Normal appearance and bowel sounds are normal. He exhibits no distension. There is no tenderness. There is no rebound and no CVA tenderness.  Genitourinary: Testes normal and penis normal. Circumcised.  Musculoskeletal: Normal range of motion. He exhibits no edema or tenderness.  Neurological: He is alert and oriented to person, place, and time. He has normal strength. No cranial nerve deficit or sensory deficit. GCS eye subscore is 4. GCS verbal subscore is 5. GCS motor subscore is 6.  Skin: Skin is warm and dry. No abrasion and no rash noted.  Psychiatric: He has a normal mood and affect. His speech is normal and behavior is normal.  Nursing note and vitals reviewed.   ED Course  Procedures (including critical care time) Labs Review Labs Reviewed - No data to display  Imaging Review No results found. I have personally reviewed and evaluated these images and lab results as part of my medical decision-making.   EKG Interpretation None      MDM   Final diagnoses:  Other male erectile dysfunction    Patient given referral to urology on call  Lacretia Leigh, MD 10/27/15 [phone]

## 2015-10-27 NOTE — ED Notes (Signed)
Bed: [id number] Expected date:  Expected time:  Means of arrival:  Comments: EMS 89M diverted from [place] in genitals/unable to have relations

## 2015-10-28 ENCOUNTER — Encounter: Payer: Self-pay | Admitting: Family Medicine

## 2015-10-28 ENCOUNTER — Ambulatory Visit: Payer: Medicaid Other | Attending: Family Medicine | Admitting: Family Medicine

## 2015-10-28 VITALS — BP 151/90 | HR 95 | Temp 98.6°F | Resp 16 | Ht 66.0 in | Wt 163.0 lb

## 2015-10-28 DIAGNOSIS — N529 Male erectile dysfunction, unspecified: Secondary | ICD-10-CM | POA: Diagnosis not present

## 2015-10-28 DIAGNOSIS — F17210 Nicotine dependence, cigarettes, uncomplicated: Secondary | ICD-10-CM | POA: Insufficient documentation

## 2015-10-28 DIAGNOSIS — F1020 Alcohol dependence, uncomplicated: Secondary | ICD-10-CM | POA: Diagnosis present

## 2015-10-28 DIAGNOSIS — M79671 Pain in right foot: Secondary | ICD-10-CM | POA: Diagnosis not present

## 2015-10-28 DIAGNOSIS — J3489 Other specified disorders of nose and nasal sinuses: Secondary | ICD-10-CM

## 2015-10-28 DIAGNOSIS — Z79899 Other long term (current) drug therapy: Secondary | ICD-10-CM | POA: Diagnosis not present

## 2015-10-28 DIAGNOSIS — R220 Localized swelling, mass and lump, head: Secondary | ICD-10-CM

## 2015-10-28 DIAGNOSIS — J329 Chronic sinusitis, unspecified: Secondary | ICD-10-CM | POA: Insufficient documentation

## 2015-10-28 DIAGNOSIS — M722 Plantar fascial fibromatosis: Secondary | ICD-10-CM | POA: Diagnosis not present

## 2015-10-28 DIAGNOSIS — I10 Essential (primary) hypertension: Secondary | ICD-10-CM | POA: Insufficient documentation

## 2015-10-28 DIAGNOSIS — R569 Unspecified convulsions: Secondary | ICD-10-CM | POA: Diagnosis not present

## 2015-10-28 DIAGNOSIS — Z8673 Personal history of transient ischemic attack (TIA), and cerebral infarction without residual deficits: Secondary | ICD-10-CM | POA: Insufficient documentation

## 2015-10-28 DIAGNOSIS — Z0000 Encounter for general adult medical examination without abnormal findings: Secondary | ICD-10-CM | POA: Diagnosis not present

## 2015-10-28 DIAGNOSIS — F10239 Alcohol dependence with withdrawal, unspecified: Secondary | ICD-10-CM

## 2015-10-28 DIAGNOSIS — I63341 Cerebral infarction due to thrombosis of right cerebellar artery: Secondary | ICD-10-CM

## 2015-10-28 LAB — POCT GLYCOSYLATED HEMOGLOBIN (HGB A1C): Hemoglobin A1C: 5.6

## 2015-10-28 MED ORDER — LEVETIRACETAM 500 MG PO TABS: 500.0000 mg | ORAL_TABLET | Freq: Two times a day (BID) | ORAL | Status: DC

## 2015-10-28 MED ORDER — HYDROXYZINE HCL 25 MG PO TABS: 25.0000 mg | ORAL_TABLET | Freq: Four times a day (QID) | ORAL | Status: DC | PRN

## 2015-10-28 MED ORDER — CLONIDINE HCL 0.1 MG PO TABS: 0.1000 mg | ORAL_TABLET | Freq: Two times a day (BID) | ORAL | Status: DC

## 2015-10-28 MED FILL — cloNIDine HCL 0.1 MG TABS: 0.1 | 30 days supply | Qty: 60 | Fill #0

## 2015-10-28 NOTE — Assessment & Plan Note (Signed)
Podiatry referral

## 2015-10-28 NOTE — Assessment & Plan Note (Signed)
Chronic mass ENT referral placed again

## 2015-10-28 NOTE — Patient Instructions (Addendum)
[name] was seen today for erectile dysfunction and foot pain.  Diagnoses and all orders for this visit:  Healthcare maintenance -     POCT glycosylated hemoglobin (Hb A1C)  Erectile dysfunction, unspecified erectile dysfunction type -     Cancel: Ambulatory referral to Urology  Seizures [facility]) -     Ambulatory referral to Neurology -     levETIRAcetam (KEPPRA) 500 MG tablet; Take 1 tablet (500 mg total) by mouth 2 (two) times daily.  Essential hypertension -     cloNIDine (CATAPRES) 0.1 MG tablet; Take 1 tablet (0.1 mg total) by mouth 2 (two) times daily.  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 -     hydrOXYzine (ATARAX/VISTARIL) 25 MG tablet; Take 1 tablet (25 mg total) by mouth every 6 (six) hours as needed (anxiety/agitation or CIWA < or = 10).  Plantar fasciitis of right foot -     Ambulatory referral to Podiatry   Alcohol abuse services available at [facility] [place], [place] and [place].   Erectile dysfunction will improve with alcohol cessation. Continue not to drink. If there is not significant improvement after 6 weeks of being sober, the next step is fasting testosterone level with possible urology referral.   Smoking cessation support: smoking cessation hotline: 1-800-QUIT-NOW.  Smoking cessation classes are available through [facility]. Call [phone] or visit our website at [email or web address]  F/u in 3 weeks for BP check and to taper off clonidine   Dr. Adrian Blackwater
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
## 2015-10-28 NOTE — Assessment & Plan Note (Signed)
Seizures and ED in setting of alcohol abuse  Advised continued cessation Atarax, clonidine and keppra to control symptoms and prevent seizures Neurology referral given hx of seizures although they all seem to be ETOH related  Advised against urology at this time as I suspect little can be done for ED until patient is sober

## 2015-10-28 NOTE — Progress Notes (Signed)
ED F/U ED  Referral Urology  Rt Heal pain  Pain scale #10  Tobacco user 1ppday  No suicidal though in the past two weeeks

## 2015-10-28 NOTE — Assessment & Plan Note (Signed)
HTN in setting of ETOH cessation, recent Clonidine to control BP and ETOH withdrawal symptoms

## 2015-10-29 ENCOUNTER — Telehealth: Payer: Self-pay | Admitting: Family Medicine

## 2015-10-29 DIAGNOSIS — I63341 Cerebral infarction due to thrombosis of right cerebellar artery: Secondary | ICD-10-CM

## 2015-10-29 DIAGNOSIS — I639 Cerebral infarction, unspecified: Secondary | ICD-10-CM | POA: Insufficient documentation

## 2015-10-29 MED ORDER — ASPIRIN 81 MG PO TABS: 81.0000 mg | ORAL_TABLET | Freq: Every day | ORAL | Status: DC

## 2015-10-29 MED ORDER — ATORVASTATIN CALCIUM 40 MG PO TABS: 40.0000 mg | ORAL_TABLET | Freq: Every day | ORAL | Status: DC

## 2015-10-29 NOTE — Assessment & Plan Note (Signed)
  Upon further review of his MRI of head done in ED on 10/26/ 2016 there is a small area of stroke  Recommend and ordered the following to prevent recurrent stroke  lipitor 40 mg daily Daily ASA 81 mg daily  Recommend following work up  ECHO Carotid doppler Neuro referral was already placed for seizures in setting of alcohol abuse. Changed referral to also include stroke.

## 2015-10-29 NOTE — Addendum Note (Signed)
Addended by: [name] on: 10/29/2015 09:18 AM   Modules accepted: Orders

## 2015-10-29 NOTE — Telephone Encounter (Signed)
Nurse called patient, reached voicemail. Left message for patient to call Latasia Silberstein with [facility], at [phone]. Nurse called patient to give patient Dr. Adrian Blackwater message.

## 2015-10-29 NOTE — Telephone Encounter (Signed)
Nurse called patient, reached voicemail. Left message for patient to call [name] with [facility], at [phone].  

## 2015-10-29 NOTE — Assessment & Plan Note (Signed)
Subacute infarct noted on 07/2015 MRI  2. Diffuse white matter disease, nonspecific but usually from chronic small vessel ischemia. There is a recent lacunar infarct in the right centrum semiovale, likely subacute.  Neurology referral placed

## 2015-10-29 NOTE — Telephone Encounter (Signed)
Please call patient and schedule studies  Upon further review of his MRI of head done in ED on 10/26/ 2016 there is a small area of stroke  Recommend and ordered the following to prevent recurrent stroke  lipitor 40 mg daily Daily ASA 81 mg daily  Recommend following work up  ECHO Carotid doppler Neuro referral was already placed for seizures in setting of alcohol abuse. Changed referral to also include stroke.

## 2015-10-30 NOTE — Telephone Encounter (Signed)
Nurse called patient, reached voicemail. Left message for patient to call Fernando Torry with [facility], at [phone]. Nurse called emergency contact number [phone], reached male explaining wrong number.

## 2015-11-04 NOTE — Telephone Encounter (Signed)
Noted  

## 2015-11-04 NOTE — Telephone Encounter (Signed)
Nurse called patient, reached voicemail. Left message for patient to call Curley Fayette with [facility], at [phone]. Nurse will send letter to patient requesting patient to call [place].

## 2015-11-05 ENCOUNTER — Telehealth: Payer: Self-pay | Admitting: *Deleted

## 2015-11-05 NOTE — Telephone Encounter (Signed)
Spoke with emergency contact which stated she does not live with the patient but has provided her contact due to patient not having a phone at this time. Medical Assistant relayed Urgent need to speak with patient as soon as possible. EC stated she would do her best to get in contact with patient and expressed the urgency for patient to return a phone call to office. Medical Assistant thanked [facility] and relayed the [facility] business hours in case patient calls after [facility] clinic hours. EC expressed her understanding and had no further questions at this time.

## 2015-11-08 NOTE — Telephone Encounter (Signed)
Pt. Called requesting to speak to the nurse regarding a letter he received to contact the [facility]. Please f/u

## 2015-11-20 ENCOUNTER — Encounter: Payer: Self-pay | Admitting: Podiatry

## 2015-11-20 ENCOUNTER — Ambulatory Visit (INDEPENDENT_AMBULATORY_CARE_PROVIDER_SITE_OTHER): Payer: Medicaid Other | Admitting: Podiatry

## 2015-11-20 ENCOUNTER — Ambulatory Visit (INDEPENDENT_AMBULATORY_CARE_PROVIDER_SITE_OTHER): Payer: Medicaid Other

## 2015-11-20 VITALS — BP 152/99 | HR 96 | Resp 12

## 2015-11-20 DIAGNOSIS — M722 Plantar fascial fibromatosis: Secondary | ICD-10-CM

## 2015-11-20 DIAGNOSIS — M79671 Pain in right foot: Secondary | ICD-10-CM

## 2015-11-20 MED ORDER — TRIAMCINOLONE ACETONIDE 10 MG/ML IJ SUSP
10.0000 mg | Freq: Once | INTRAMUSCULAR | Status: AC
  Administered 2015-11-20: 10 mg

## 2015-11-20 NOTE — Progress Notes (Signed)
   Subjective:    Patient ID: [name], male    DOB: 1961/04/30, 55 y.o.   MRN: [id number]  HPI   This patient presents today complaining of a 1 year history of a painful inferior right heel aggravated with first step in the morning prolonged standing walking relieved with rest and elevation and soaking in Epsom salts. The symptoms have gradually worsened over time causing him to limp when he walks and stands. Patient has changed his shoes and place cushions inside shoe without any relief of symptoms. He denies any direct injury to the foot.     Review of Systems  Musculoskeletal: Positive for gait problem.       Objective:   Physical Exam  Patient appears responsive and orientated 3  Vascular: No peripheral edema bilaterally DP and PT pulses 2/4 bilaterally Capillary reflex immediate bilaterally  Neurological: Sensation to 10 g monofilament wire intact 5/5 bilaterally Vibratory sensation reactive bilaterally Ankle reflexes equal and reactive bilaterally  Dermatological: No open skin lesions bilaterally Dry scaling skin bilaterally  Musculoskeletal: HAV bilaterally Pes planus bilaterally Exquisite palpable tenderness medial plantar fascial insertional area right which duplicates area of discomfort The fat pad is adequate. Patient has limping gait favoring right foot   X-ray examination weightbearing right foot dated 11/20/2015  Intact bony structure without fracture and/or dislocation HAV Posterior and inferior calcaneal spurs Bone density adequate Joint spaces adequate No increased in soft tissue density  Radiographic impression: No acute bony abnormality noted in the right foot dated 11/20/2015       Assessment & Plan:   Assessment: Satisfactory neurovascular status Plantar fasciitis right  Plan: Today I reviewed the results of x-ray examination with patient today describes plantar fasciitis in detail. I offered a Kenalog Injection into  the inferior right heel and verbally consents  Skin is prepped with alcohol and Betadine and 10 mg of Kenalog mixed with 10 mg of plain Xylocaine and 2.5 mg of plain Marcaine were injected inferior heel right for Kenalog injection #1. Patient tolerated injection satisfactorily, however, the  Screamed and uttered a profanity with the introduction of the needle.  Fascial taping laced on right foot  Remove dressing 3-5 days  Reappoint 4 weeks

## 2015-11-20 NOTE — Patient Instructions (Signed)
If possible leave bandage on right foot up to 5 days and keep dry Do not go barefoot  Plantar Fasciitis Plantar fasciitis is a painful foot condition that affects the heel. It occurs when the band of tissue that connects the toes to the heel bone (plantar fascia) becomes irritated. This can happen after exercising too much or doing other repetitive activities (overuse injury). The pain from plantar fasciitis can range from mild irritation to severe pain that makes it difficult for you to walk or move. The pain is usually worse in the morning or after you have been sitting or lying down for a while. CAUSES This condition may be caused by:  Standing for long periods of time.  Wearing shoes that do not fit.  Doing high-impact activities, including running, aerobics, and ballet.  Being overweight.  Having an abnormal way of walking (gait).  Having tight calf muscles.  Having high arches in your feet.  Starting a new athletic activity. SYMPTOMS The main symptom of this condition is heel pain. Other symptoms include:  Pain that gets worse after activity or exercise.  Pain that is worse in the morning or after resting.  Pain that goes away after you walk for a few minutes. DIAGNOSIS This condition may be diagnosed based on your signs and symptoms. Your health care provider will also do a physical exam to check for:  A tender area on the bottom of your foot.  A high arch in your foot.  Pain when you move your foot.  Difficulty moving your foot. You may also need to have imaging studies to confirm the diagnosis. These can include:  X-rays.  Ultrasound.  MRI. TREATMENT  Treatment for plantar fasciitis depends on the severity of the condition. Your treatment may include:  Rest, ice, and over-the-counter pain medicines to manage your pain.  Exercises to stretch your calves and your plantar fascia.  A splint that holds your foot in a stretched, upward position while you  sleep (night splint).  Physical therapy to relieve symptoms and prevent problems in the future.  Cortisone injections to relieve severe pain.  Extracorporeal shock wave therapy (ESWT) to stimulate damaged plantar fascia with electrical impulses. It is often used as a last resort before surgery.  Surgery, if other treatments have not worked after 12 months. HOME CARE INSTRUCTIONS  Take medicines only as directed by your health care provider.  Avoid activities that cause pain.  Roll the bottom of your foot over a bag of ice or a bottle of cold water. Do this for 20 minutes, 3-4 times a day.  Perform simple stretches as directed by your health care provider.  Try wearing athletic shoes with air-sole or gel-sole cushions or soft shoe inserts.  Wear a night splint while sleeping, if directed by your health care provider.  Keep all follow-up appointments with your health care provider. PREVENTION   Do not perform exercises or activities that cause heel pain.  Consider finding low-impact activities if you continue to have problems.  Lose weight if you need to. The best way to prevent plantar fasciitis is to avoid the activities that aggravate your plantar fascia. SEEK MEDICAL CARE IF:  Your symptoms do not go away after treatment with home care measures.  Your pain gets worse.  Your pain affects your ability to move or do your daily activities.   This information is not intended to replace advice given to you by your health care provider. Make sure you discuss any questions  you have with your health care provider.   Document Released: 06/16/2001 Document Revised: 06/12/2015 Document Reviewed: 08/01/2014 Elsevier Interactive Patient Education [employer].

## 2015-11-25 NOTE — Telephone Encounter (Signed)
Medical Assistant left message on patient's home and cell voicemail. Voicemail states to give a call back to Nubia with [facility] at [phone].  

## 2015-11-29 ENCOUNTER — Ambulatory Visit: Payer: Self-pay | Admitting: Neurology

## 2015-12-18 ENCOUNTER — Ambulatory Visit: Payer: Medicaid Other | Admitting: Podiatry

## 2015-12-31 ENCOUNTER — Ambulatory Visit: Payer: Medicaid Other | Admitting: Podiatry

## 2016-01-23 ENCOUNTER — Emergency Department (HOSPITAL_COMMUNITY)
Admission: EM | Admit: 2016-01-23 | Discharge: 2016-01-24 | Disposition: A | Discharge status: Home / Self Care | Payer: Medicaid Other | Attending: Emergency Medicine | Admitting: Emergency Medicine

## 2016-01-23 DIAGNOSIS — F10920 Alcohol use, unspecified with intoxication, uncomplicated: Secondary | ICD-10-CM

## 2016-01-23 DIAGNOSIS — F10120 Alcohol abuse with intoxication, uncomplicated: Secondary | ICD-10-CM | POA: Insufficient documentation

## 2016-01-23 DIAGNOSIS — R220 Localized swelling, mass and lump, head: Secondary | ICD-10-CM | POA: Diagnosis not present

## 2016-01-23 DIAGNOSIS — I10 Essential (primary) hypertension: Secondary | ICD-10-CM | POA: Insufficient documentation

## 2016-01-23 DIAGNOSIS — Z7982 Long term (current) use of aspirin: Secondary | ICD-10-CM | POA: Diagnosis not present

## 2016-01-23 DIAGNOSIS — F319 Bipolar disorder, unspecified: Secondary | ICD-10-CM | POA: Diagnosis not present

## 2016-01-23 DIAGNOSIS — G40909 Epilepsy, unspecified, not intractable, without status epilepticus: Secondary | ICD-10-CM | POA: Insufficient documentation

## 2016-01-23 DIAGNOSIS — F17210 Nicotine dependence, cigarettes, uncomplicated: Secondary | ICD-10-CM | POA: Diagnosis not present

## 2016-01-23 DIAGNOSIS — J3489 Other specified disorders of nose and nasal sinuses: Secondary | ICD-10-CM

## 2016-01-23 NOTE — ED Notes (Signed)
Bed: [id number] Expected date:  Expected time:  Means of arrival:  Comments: EMS 55 yo male nosebleed/ETOH

## 2016-01-23 NOTE — ED Notes (Signed)
Pt BIB EMS from home. C/o mass in L nostril that bleeds occasionally. Hx of same. Pt states that he had on removed a few years prior to visit today. Pt obviously intoxicated. Pt yelling and uncooperative in triage.   EMS vitals 140/100 96 HR, Hx HTN. Pt reports taking AM meds, but not taking evening meds at home.

## 2016-01-23 NOTE — ED Notes (Signed)
Pt stood up at bedside and urinated in the floor

## 2016-01-24 LAB — ETHANOL: Alcohol, Ethyl (B): 270 mg/dL — ABNORMAL HIGH (ref <5)

## 2016-01-24 NOTE — ED Notes (Signed)
Pt ambulated to the restroom with no assistance (this nurse stood by for safety)

## 2016-01-24 NOTE — ED Provider Notes (Signed)
The patient was seen by Irena Cords, PA-C, who signs out at end of shift. The patient presented acutely intoxicated with complaint of nasal mass. He has been allowed to sober up in the emergency department over night and is awake. He is alert, oriented. He complains of nasal mass that has been there "for a long time" and is insistent on obtaining x-rays for evaluation. Discussed that his PCP would be appropriate follow up for this type of evaluation. He is felt stable for discharge.   Charlann Lange, PA-C [id number]  Delora Fuel, MD [id number] [phone]

## 2016-01-24 NOTE — ED Provider Notes (Signed)
CSN: [id number]     Arrival date & time 01/23/16  2229 History   First MD Initiated Contact with Patient 01/23/16 2323     Chief Complaint  Patient presents with  . Alcohol Intoxication  . Mass    L nostril     (Consider location/radiation/quality/duration/timing/severity/associated sxs/prior Treatment) HPI Patient presents to the emergency department with nosebleeds that have been happening at varying intervals.  Patient did not have any nose bleed tonight.  The patient has been drinking alcohol and will not give me any other history Past Medical History  Diagnosis Date  . Seizures ([place])   . Pneumonia   . Alcohol abuse   . Hypertension   . Bipolar 1 disorder [facility])    Past Surgical History  Procedure Laterality Date  . Brain tumor excision     Family History  Problem Relation Age of Onset  . Alcoholism Mother   . Alcoholism Father    Social History  Substance Use Topics  . Smoking status: Current Some Day Smoker -- 1.00 packs/day    Types: Cigarettes  . Smokeless tobacco: Not on file  . Alcohol Use: 7.2 oz/week    12 Cans of beer per week     Comment: Last [place] intake 10/25/2015    Review of Systems  Level V caveat applies due to alcohol intoxication  Allergies  Borax  Home Medications   Prior to Admission medications   Medication Sig Start Date End Date Taking? Authorizing Provider  aspirin 81 MG tablet Take 1 tablet (81 mg total) by mouth daily. 10/29/15   Josalyn Funches, MD  atorvastatin (LIPITOR) 40 MG tablet Take 1 tablet (40 mg total) by mouth daily. 10/29/15   Josalyn Funches, MD  cloNIDine (CATAPRES) 0.1 MG tablet Take 1 tablet (0.1 mg total) by mouth 2 (two) times daily. 10/28/15   Josalyn Funches, MD  fluticasone (FLONASE) 50 MCG/ACT nasal spray Place into the nose.    Historical Provider, MD  hydrOXYzine (ATARAX/VISTARIL) 25 MG tablet Take 1 tablet (25 mg total) by mouth every 6 (six) hours as needed (anxiety/agitation or CIWA < or = 10). 10/28/15    Josalyn Funches, MD  levETIRAcetam (KEPPRA) 500 MG tablet Take 1 tablet (500 mg total) by mouth 2 (two) times daily. 10/28/15   Josalyn Funches, MD   BP 118/98 mmHg  Pulse 81  Temp(Src) 97.9 F (36.6 C) (Oral)  Resp 16  SpO2 93% Physical Exam  Constitutional: He is oriented to person, place, and time. He appears well-developed and well-nourished. No distress.  HENT:  Head: Normocephalic and atraumatic.  Nose:    Mouth/Throat: Oropharynx is clear and moist.  Eyes: Pupils are equal, round, and reactive to light.  Neck: Normal range of motion. Neck supple.  Cardiovascular: Normal rate, regular rhythm and normal heart sounds.  Exam reveals no gallop and no friction rub.   No murmur heard. Pulmonary/Chest: Effort normal and breath sounds normal. No respiratory distress. He has no wheezes.  Neurological: He is alert and oriented to person, place, and time. He exhibits normal muscle tone. Coordination normal.  Skin: Skin is warm and dry. No rash noted. No erythema.  Psychiatric: He has a normal mood and affect. His behavior is normal.  Nursing note and vitals reviewed.   ED Course  Procedures (including critical care time) Labs Review Labs Reviewed  ETHANOL    Imaging Review No results found. I have personally reviewed and evaluated these images and lab results as part of my medical decision-making.  Patient will need to become clinically sober and have a ride home.  Patient is sleeping at this time   Dalia Heading, PA-C [phone]  David Glick, MD [phone]

## 2016-01-24 NOTE — Discharge Instructions (Signed)
FOLLOW UP WITH YOUR DOCTOR FOR FURTHER MANAGEMENT OF KNOWN NOSE MASS AND SWELLING.

## 2016-04-15 ENCOUNTER — Encounter (HOSPITAL_COMMUNITY): Payer: Self-pay

## 2016-04-15 ENCOUNTER — Other Ambulatory Visit: Payer: Self-pay | Admitting: Otolaryngology

## 2016-04-15 ENCOUNTER — Encounter (HOSPITAL_COMMUNITY)
Admission: RE | Admit: 2016-04-15 | Discharge: 2016-04-15 | Disposition: A | Discharge status: Home / Self Care | Payer: Medicaid Other | Source: Ambulatory Visit | Attending: Otolaryngology | Admitting: Otolaryngology

## 2016-04-15 ENCOUNTER — Other Ambulatory Visit (HOSPITAL_COMMUNITY): Payer: Self-pay | Admitting: *Deleted

## 2016-04-15 DIAGNOSIS — Z01812 Encounter for preprocedural laboratory examination: Secondary | ICD-10-CM | POA: Diagnosis present

## 2016-04-15 HISTORY — DX: Peripheral vascular disease, unspecified: I73.9

## 2016-04-15 HISTORY — DX: Inflammatory liver disease, unspecified: K75.9

## 2016-04-15 HISTORY — DX: Gastric ulcer, unspecified as acute or chronic, without hemorrhage or perforation: K25.9

## 2016-04-15 HISTORY — DX: Cerebral infarction, unspecified: I63.9

## 2016-04-15 LAB — CBC
HEMATOCRIT: 42.4 % (ref 39.0–52.0)
Hemoglobin: 13.6 g/dL (ref 13.0–17.0)
MCH: 29.9 pg (ref 26.0–34.0)
MCHC: 32.1 g/dL (ref 30.0–36.0)
MCV: 93.2 fL (ref 78.0–100.0)
Platelets: 194 10*3/uL (ref 150–400)
RBC: 4.55 MIL/uL (ref 4.22–5.81)
RDW: 13.1 % (ref 11.5–15.5)
WBC: 8 10*3/uL (ref 4.0–10.5)

## 2016-04-15 LAB — COMPREHENSIVE METABOLIC PANEL
ALT: 13 U/L — AB (ref 17–63)
AST: 18 U/L (ref 15–41)
Albumin: 3.4 g/dL — ABNORMAL LOW (ref 3.5–5.0)
Alkaline Phosphatase: 68 U/L (ref 38–126)
Anion gap: 6 (ref 5–15)
BUN: 13 mg/dL (ref 6–20)
CHLORIDE: 106 mmol/L (ref 101–111)
CO2: 24 mmol/L (ref 22–32)
CREATININE: 0.88 mg/dL (ref 0.61–1.24)
Calcium: 8.7 mg/dL — ABNORMAL LOW (ref 8.9–10.3)
Glucose, Bld: 86 mg/dL (ref 65–99)
POTASSIUM: 4.1 mmol/L (ref 3.5–5.1)
SODIUM: 136 mmol/L (ref 135–145)
Total Bilirubin: 0.6 mg/dL (ref 0.3–1.2)
Total Protein: 7.3 g/dL (ref 6.5–8.1)

## 2016-04-15 LAB — PROTIME-INR
INR: 1.01 (ref 0.00–1.49)
PROTHROMBIN TIME: 13.5 s (ref 11.6–15.2)

## 2016-04-15 LAB — APTT: aPTT: 34 seconds (ref 24–37)

## 2016-04-15 NOTE — Progress Notes (Signed)
   04/15/16 1304  OBSTRUCTIVE SLEEP APNEA  Have you ever been diagnosed with sleep apnea through a sleep study? No  Do you snore loudly (loud enough to be heard through closed doors)?  1  Do you often feel tired, fatigued, or sleepy during the daytime (such as falling asleep during driving or talking to someone)? 0  Has anyone observed you stop breathing during your sleep? 1  Do you have, or are you being treated for high blood pressure? 1  BMI more than 35 kg/m2? 0  Age > 50 (1-yes) 1  Neck circumference greater than:Male 16 inches or larger, Male 17inches or larger? 0 (14.5)  Male Gender (Yes=1) 1  Obstructive Sleep Apnea Score 5  Score 5 or greater  Results sent to PCP

## 2016-04-15 NOTE — Pre-Procedure Instructions (Addendum)
[name]  04/15/2016      [id number] [address], [place] [address] Phone: [phone] Fax: [place], [place] [address] [place] [address] Phone: [phone] Fax: [phone]    Your procedure is scheduled on Friday, April 17, 2016   Report to [facility] Entrance "A" Admitting Office at 8:00 AM.   Call this number if you have problems the morning of surgery: [phone]   Any questions prior to day of surgery, please call [phone] between 8 & 4 PM.   Remember:  Do not eat food or drink liquids after midnight Thursday, 04/16/16.  Take these medicines the morning of surgery with A SIP OF WATER: clonidine, dilantin,abilify,zantac   Stop Aspirin as of today if not already done so.stop,naproxen   Do not wear jewelry.  Do not wear lotions, powders, or cologne.  You may wear deoderant.  Men may shave face and neck.  Do not bring valuables to the hospital.  [facility] is not responsible for any belongings or valuables.  Contacts, dentures or bridgework may not be worn into surgery.  Leave your suitcase in the car.  After surgery it may be brought to your room.  For patients admitted to the hospital, discharge time will be determined by your treatment team.  Patients discharged the day of surgery will not be allowed to drive home.   Special instructions:  [place] - Preparing for Surgery  Before surgery, you can play an important role.  Because skin is not sterile, your skin needs to be as free of germs as possible.  You can reduce the number of germs on you skin by washing with CHG (chlorahexidine gluconate) soap before surgery.  CHG is an antiseptic cleaner which kills germs and bonds with the skin to continue killing germs even after washing.  Please DO NOT use if you have an allergy to CHG or antibacterial  soaps.  If your skin becomes reddened/irritated stop using the CHG and inform your nurse when you arrive at Short Stay.  Do not shave (including legs and underarms) for at least 48 hours prior to the first CHG shower.  You may shave your face.  Please follow these instructions carefully:   1.  Shower with CHG Soap the night before surgery and the                                morning of Surgery.  2.  If you choose to wash your hair, wash your hair first as usual with your       normal shampoo.  3.  After you shampoo, rinse your hair and body thoroughly to remove the                      Shampoo.  4.  Use CHG as you would any other liquid soap.  You can apply chg directly       to the skin and wash gently with scrungie or a clean washcloth.  5.  Apply the CHG Soap to your body ONLY FROM THE NECK DOWN.        Do not use on open wounds or open sores.  Avoid contact with your eyes, ears, mouth and genitals (private parts).  Wash genitals (private parts) with  your normal soap.  6.  Wash thoroughly, paying special attention to the area where your surgery        will be performed.  7.  Thoroughly rinse your body with warm water from the neck down.  8.  DO NOT shower/wash with your normal soap after using and rinsing off       the CHG Soap.  9.  Pat yourself dry with a clean towel.            10.  Wear clean pajamas.            11.  Place clean sheets on your bed the night of your first shower and do not        sleep with pets.  Day of Surgery  Do not apply any lotions the morning of surgery.  Please wear clean clothes to the hospital.   Please read over the following fact sheets that you were given.

## 2016-04-15 NOTE — Progress Notes (Signed)
   04/15/16 1328  OBSTRUCTIVE SLEEP APNEA  Score 5 or greater  Results sent to PCP

## 2016-04-15 NOTE — Progress Notes (Signed)
Anesthesia Chart Review:  Pt is a 55 year old male scheduled for endoscopic L sinus surgery with navigation, L sinus endo with fusion on 04/17/2016 with Christopher Quitter, MD.   PMH includes:  HTN, stroke, alcohol abuse, bipolar disorder, hepatitis, seizures. Current smoker. BMI 25  Medications include: abilify, lipitor, clonidine, lisinopril-hctz, dilantin, zantac, sildenafil  Preoperative labs reviewed.    EKG 10/21/15: Sinus rhythm. Low voltage, precordial leads  MR brain 07/31/15:  1. No seizure focus identified. 2. Diffuse white matter disease, nonspecific but usually from chronic small vessel ischemia. There is a recent lacunar infarct in the right centrum semiovale, likely subacute. 3. Chronic left maxillary sinus mass filling the left nasal cavity and obstructing the left middle meatus. Appearance favors inverted papilloma and ENT referral recommended. 4. Premature generalized atrophy.  PCP is Christopher Nearing, MD. Dr. Adrian Herrera ordered echo and carotid duplex as part of stroke work up in 10/2015 due to finding of subacute stroke on 07/2015 MR, but they were unable to get in touch with pt and tests were never done.   Reviewed case with Dr. Conrad Trout Herrera.   If no changes, I anticipate pt can proceed with surgery as scheduled.   Christopher Cass, FNP-BC [facility]/Anesthesiology Phone: [phone] 04/15/2016 2:51 PM

## 2016-04-17 ENCOUNTER — Encounter (HOSPITAL_COMMUNITY): Payer: Self-pay | Admitting: Surgery

## 2016-04-17 ENCOUNTER — Ambulatory Visit (HOSPITAL_COMMUNITY): Payer: Medicaid Other | Admitting: Certified Registered Nurse Anesthetist

## 2016-04-17 ENCOUNTER — Encounter (HOSPITAL_COMMUNITY)
Admission: RE | Disposition: A | Discharge status: Home / Self Care | Payer: Self-pay | Source: Ambulatory Visit | Attending: Otolaryngology

## 2016-04-17 ENCOUNTER — Observation Stay (HOSPITAL_COMMUNITY)
Admission: RE | Admit: 2016-04-17 | Discharge: 2016-04-18 | Disposition: A | Discharge status: Home / Self Care | Payer: Medicaid Other | Source: Ambulatory Visit | Attending: Otolaryngology | Admitting: Otolaryngology

## 2016-04-17 ENCOUNTER — Ambulatory Visit (HOSPITAL_COMMUNITY): Payer: Medicaid Other | Admitting: Emergency Medicine

## 2016-04-17 DIAGNOSIS — R569 Unspecified convulsions: Secondary | ICD-10-CM | POA: Insufficient documentation

## 2016-04-17 DIAGNOSIS — Z79899 Other long term (current) drug therapy: Secondary | ICD-10-CM | POA: Diagnosis not present

## 2016-04-17 DIAGNOSIS — Z791 Long term (current) use of non-steroidal anti-inflammatories (NSAID): Secondary | ICD-10-CM | POA: Diagnosis not present

## 2016-04-17 DIAGNOSIS — D140 Benign neoplasm of middle ear, nasal cavity and accessory sinuses: Secondary | ICD-10-CM | POA: Diagnosis present

## 2016-04-17 DIAGNOSIS — I10 Essential (primary) hypertension: Secondary | ICD-10-CM | POA: Insufficient documentation

## 2016-04-17 DIAGNOSIS — Z7982 Long term (current) use of aspirin: Secondary | ICD-10-CM | POA: Diagnosis not present

## 2016-04-17 DIAGNOSIS — F17210 Nicotine dependence, cigarettes, uncomplicated: Secondary | ICD-10-CM | POA: Diagnosis not present

## 2016-04-17 DIAGNOSIS — I69331 Monoplegia of upper limb following cerebral infarction affecting right dominant side: Secondary | ICD-10-CM | POA: Insufficient documentation

## 2016-04-17 HISTORY — PX: SINUS ENDO WITH FUSION: SHX5329

## 2016-04-17 LAB — TYPE AND SCREEN
ABO/RH(D): A NEG
Antibody Screen: NEGATIVE

## 2016-04-17 LAB — ABO/RH: ABO/RH(D): A NEG

## 2016-04-17 SURGERY — SURGERY, PARANASAL SINUS, ENDOSCOPIC, WITH NASAL SEPTOPLASTY, TURBINOPLASTY, AND MAXILLARY SINUSOTOMY
Anesthesia: General | Site: Nose | Laterality: Left

## 2016-04-17 MED ORDER — PROMETHAZINE HCL 25 MG/ML IJ SOLN: 6.2500 mg | INTRAMUSCULAR | Status: DC | PRN

## 2016-04-17 MED ORDER — MIDAZOLAM HCL 2 MG/2ML IJ SOLN
INTRAMUSCULAR | Status: AC
  Filled 2016-04-17: qty 2

## 2016-04-17 MED ORDER — DEXMEDETOMIDINE HCL 200 MCG/2ML IV SOLN
INTRAVENOUS | Status: DC | PRN
  Administered 2016-04-17 (×2): 8 ug via INTRAVENOUS

## 2016-04-17 MED ORDER — PHENYLEPHRINE 40 MCG/ML (10ML) SYRINGE FOR IV PUSH (FOR BLOOD PRESSURE SUPPORT)
PREFILLED_SYRINGE | INTRAVENOUS | Status: AC
  Filled 2016-04-17: qty 10

## 2016-04-17 MED ORDER — HYDROMORPHONE HCL 1 MG/ML IJ SOLN
INTRAMUSCULAR | Status: AC
  Administered 2016-04-17: 0.5 mg via INTRAVENOUS
  Filled 2016-04-17: qty 2

## 2016-04-17 MED ORDER — LACTATED RINGERS IV SOLN
INTRAVENOUS | Status: DC
  Administered 2016-04-17 (×2): via INTRAVENOUS

## 2016-04-17 MED ORDER — PHENYLEPHRINE HCL 10 MG/ML IJ SOLN
10.0000 mg | INTRAVENOUS | Status: DC | PRN
  Administered 2016-04-17: 10 ug/min via INTRAVENOUS

## 2016-04-17 MED ORDER — FENTANYL CITRATE (PF) 100 MCG/2ML IJ SOLN
INTRAMUSCULAR | Status: AC
  Administered 2016-04-17: 50 ug via INTRAVENOUS
  Filled 2016-04-17: qty 2

## 2016-04-17 MED ORDER — MUPIROCIN 2 % EX OINT
TOPICAL_OINTMENT | CUTANEOUS | Status: DC | PRN
  Administered 2016-04-17: 1 via NASAL

## 2016-04-17 MED ORDER — LISINOPRIL 20 MG PO TABS
20.0000 mg | ORAL_TABLET | Freq: Every day | ORAL | Status: DC
  Filled 2016-04-17: qty 1

## 2016-04-17 MED ORDER — HYDROMORPHONE HCL 1 MG/ML IJ SOLN
0.5000 mg | INTRAMUSCULAR | Status: AC | PRN
  Administered 2016-04-17 (×4): 0.5 mg via INTRAVENOUS

## 2016-04-17 MED ORDER — CEPHALEXIN 500 MG PO CAPS
500.0000 mg | ORAL_CAPSULE | Freq: Three times a day (TID) | ORAL | Status: DC
  Administered 2016-04-17 – 2016-04-18 (×2): 500 mg via ORAL
  Filled 2016-04-17 (×2): qty 1

## 2016-04-17 MED ORDER — ONDANSETRON HCL 4 MG/2ML IJ SOLN
INTRAMUSCULAR | Status: DC | PRN
  Administered 2016-04-17: 4 mg via INTRAVENOUS

## 2016-04-17 MED ORDER — LIDOCAINE-EPINEPHRINE 1 %-1:100000 IJ SOLN
INTRAMUSCULAR | Status: DC | PRN
  Administered 2016-04-17: 20 mL

## 2016-04-17 MED ORDER — MIDAZOLAM HCL 5 MG/5ML IJ SOLN
INTRAMUSCULAR | Status: DC | PRN
  Administered 2016-04-17: 2 mg via INTRAVENOUS

## 2016-04-17 MED ORDER — ARTIFICIAL TEARS OP OINT
TOPICAL_OINTMENT | OPHTHALMIC | Status: AC
  Filled 2016-04-17: qty 3.5

## 2016-04-17 MED ORDER — SALINE SPRAY 0.65 % NA SOLN
2.0000 | NASAL | Status: DC
  Administered 2016-04-17 – 2016-04-18 (×3): 2 via NASAL
  Filled 2016-04-17: qty 44

## 2016-04-17 MED ORDER — FENTANYL CITRATE (PF) 100 MCG/2ML IJ SOLN
25.0000 ug | INTRAMUSCULAR | Status: DC | PRN
  Administered 2016-04-17 (×3): 50 ug via INTRAVENOUS
  Administered 2016-04-17: 25 ug via INTRAVENOUS

## 2016-04-17 MED ORDER — SUCCINYLCHOLINE CHLORIDE 20 MG/ML IJ SOLN
INTRAMUSCULAR | Status: DC | PRN
  Administered 2016-04-17: 100 mg via INTRAVENOUS

## 2016-04-17 MED ORDER — CHLORHEXIDINE GLUCONATE CLOTH 2 % EX PADS: 6.0000 | MEDICATED_PAD | Freq: Once | CUTANEOUS | Status: DC

## 2016-04-17 MED ORDER — LIDOCAINE HCL (CARDIAC) 20 MG/ML IV SOLN
INTRAVENOUS | Status: DC | PRN
  Administered 2016-04-17: 100 mg via INTRAVENOUS

## 2016-04-17 MED ORDER — CLONIDINE HCL 0.1 MG PO TABS
0.1000 mg | ORAL_TABLET | Freq: Two times a day (BID) | ORAL | Status: DC
  Administered 2016-04-17: 0.1 mg via ORAL
  Filled 2016-04-17: qty 1

## 2016-04-17 MED ORDER — OXYCODONE HCL 5 MG/5ML PO SOLN: 5.0000 mg | Freq: Once | ORAL | Status: DC | PRN

## 2016-04-17 MED ORDER — LISINOPRIL-HYDROCHLOROTHIAZIDE 20-12.5 MG PO TABS: 1.0000 | ORAL_TABLET | Freq: Every day | ORAL | Status: DC

## 2016-04-17 MED ORDER — SUGAMMADEX SODIUM 200 MG/2ML IV SOLN
INTRAVENOUS | Status: DC | PRN
  Administered 2016-04-17: 200 mg via INTRAVENOUS

## 2016-04-17 MED ORDER — HEMOSTATIC AGENTS (NO CHARGE) OPTIME
TOPICAL | Status: DC | PRN
  Administered 2016-04-17: 1 via TOPICAL

## 2016-04-17 MED ORDER — ARTIFICIAL TEARS OP OINT
TOPICAL_OINTMENT | OPHTHALMIC | Status: DC | PRN
  Administered 2016-04-17: 1 via OPHTHALMIC

## 2016-04-17 MED ORDER — SILDENAFIL CITRATE 20 MG PO TABS
20.0000 mg | ORAL_TABLET | Freq: Every day | ORAL | Status: DC
  Filled 2016-04-17 (×2): qty 1

## 2016-04-17 MED ORDER — ROCURONIUM BROMIDE 50 MG/5ML IV SOLN
INTRAVENOUS | Status: AC
  Filled 2016-04-17: qty 1

## 2016-04-17 MED ORDER — OXYMETAZOLINE HCL 0.05 % NA SOLN
NASAL | Status: DC | PRN
  Administered 2016-04-17: 1 via TOPICAL

## 2016-04-17 MED ORDER — KCL IN DEXTROSE-NACL 20-5-0.45 MEQ/L-%-% IV SOLN
INTRAVENOUS | Status: DC
  Administered 2016-04-17 – 2016-04-18 (×2): via INTRAVENOUS
  Filled 2016-04-17 (×2): qty 1000

## 2016-04-17 MED ORDER — PHENYTOIN SODIUM EXTENDED 100 MG PO CAPS
100.0000 mg | ORAL_CAPSULE | Freq: Two times a day (BID) | ORAL | Status: DC
  Administered 2016-04-17: 100 mg via ORAL
  Filled 2016-04-17: qty 1

## 2016-04-17 MED ORDER — SUGAMMADEX SODIUM 200 MG/2ML IV SOLN
INTRAVENOUS | Status: AC
  Filled 2016-04-17: qty 2

## 2016-04-17 MED ORDER — PROPOFOL 10 MG/ML IV BOLUS
INTRAVENOUS | Status: DC | PRN
  Administered 2016-04-17: 200 mg via INTRAVENOUS
  Administered 2016-04-17: 30 mg via INTRAVENOUS

## 2016-04-17 MED ORDER — SUCCINYLCHOLINE CHLORIDE 200 MG/10ML IV SOSY
PREFILLED_SYRINGE | INTRAVENOUS | Status: AC
  Filled 2016-04-17: qty 10

## 2016-04-17 MED ORDER — MORPHINE SULFATE (PF) 2 MG/ML IV SOLN
2.0000 mg | INTRAVENOUS | Status: DC | PRN
  Administered 2016-04-17: 4 mg via INTRAVENOUS
  Administered 2016-04-17: 2 mg via INTRAVENOUS
  Filled 2016-04-17: qty 2
  Filled 2016-04-17: qty 1

## 2016-04-17 MED ORDER — HYDROCODONE-ACETAMINOPHEN 5-325 MG PO TABS
1.0000 | ORAL_TABLET | ORAL | Status: DC | PRN
  Administered 2016-04-18: 2 via ORAL
  Filled 2016-04-17: qty 2

## 2016-04-17 MED ORDER — LIDOCAINE-EPINEPHRINE 1 %-1:100000 IJ SOLN
INTRAMUSCULAR | Status: AC
  Filled 2016-04-17: qty 1

## 2016-04-17 MED ORDER — LIDOCAINE 2% (20 MG/ML) 5 ML SYRINGE
INTRAMUSCULAR | Status: AC
  Filled 2016-04-17: qty 5

## 2016-04-17 MED ORDER — LACTATED RINGERS IV SOLN
INTRAVENOUS | Status: DC | PRN
  Administered 2016-04-17: 11:00:00 via INTRAVENOUS

## 2016-04-17 MED ORDER — NICOTINE 21 MG/24HR TD PT24
21.0000 mg | MEDICATED_PATCH | Freq: Every day | TRANSDERMAL | Status: DC
  Administered 2016-04-17: 21 mg via TRANSDERMAL
  Filled 2016-04-17: qty 1

## 2016-04-17 MED ORDER — HYDROMORPHONE HCL 1 MG/ML IJ SOLN
INTRAMUSCULAR | Status: AC
  Filled 2016-04-17: qty 1

## 2016-04-17 MED ORDER — FENTANYL CITRATE (PF) 250 MCG/5ML IJ SOLN
INTRAMUSCULAR | Status: AC
  Filled 2016-04-17: qty 5

## 2016-04-17 MED ORDER — NAPROXEN 250 MG PO TABS
500.0000 mg | ORAL_TABLET | Freq: Three times a day (TID) | ORAL | Status: DC
  Administered 2016-04-17: 500 mg via ORAL
  Filled 2016-04-17: qty 2

## 2016-04-17 MED ORDER — CEFAZOLIN SODIUM-DEXTROSE 2-4 GM/100ML-% IV SOLN
INTRAVENOUS | Status: AC
  Administered 2016-04-17: 2 g via INTRAVENOUS
  Filled 2016-04-17: qty 100

## 2016-04-17 MED ORDER — HYDROCHLOROTHIAZIDE 12.5 MG PO CAPS: 12.5000 mg | ORAL_CAPSULE | Freq: Every day | ORAL | Status: DC

## 2016-04-17 MED ORDER — FENTANYL CITRATE (PF) 100 MCG/2ML IJ SOLN
INTRAMUSCULAR | Status: DC | PRN
  Administered 2016-04-17: 100 ug via INTRAVENOUS
  Administered 2016-04-17 (×2): 50 ug via INTRAVENOUS

## 2016-04-17 MED ORDER — OXYCODONE HCL 5 MG PO TABS: 5.0000 mg | ORAL_TABLET | Freq: Once | ORAL | Status: DC | PRN

## 2016-04-17 MED ORDER — ROCURONIUM BROMIDE 100 MG/10ML IV SOLN
INTRAVENOUS | Status: DC | PRN
  Administered 2016-04-17: 40 mg via INTRAVENOUS

## 2016-04-17 MED ORDER — SODIUM CHLORIDE 0.9 % IR SOLN
Status: DC | PRN
Start: 2016-04-17 — End: 2016-04-17
  Administered 2016-04-17: 1000 mL

## 2016-04-17 SURGICAL SUPPLY — 53 items
ATTRACTOMAT 16X20 MAGNETIC DRP (DRAPES) IMPLANT
BLADE RAD 40 CVD SINUS 4MM (BLADE) IMPLANT
BLADE RAD40 ROTATE 4M 4 5PK (BLADE) IMPLANT
BLADE RAD60 ROTATE M4 4 5PK (BLADE) ×2 IMPLANT
BLADE ROTATE TRICUT 4X13 M4 (BLADE) ×3 IMPLANT
BLADE SURG 15 STRL LF DISP TIS (BLADE) IMPLANT
BLADE SURG 15 STRL SS (BLADE)
BLADE TRICUT ROTATE M4 4 5PK (BLADE) ×3 IMPLANT
CANISTER SUCTION 2500CC (MISCELLANEOUS) ×3 IMPLANT
CLSR STERI-STRIP ANTIMIC 1/2X4 (GAUZE/BANDAGES/DRESSINGS) ×3 IMPLANT
COAGULATOR SUCT SWTCH 10FR 6 (ELECTROSURGICAL) IMPLANT
CRADLE DONUT ADULT HEAD (MISCELLANEOUS) IMPLANT
DRAPE PROXIMA HALF (DRAPES) IMPLANT
DRESSING NASAL POPE 10X1.5X2.5 (GAUZE/BANDAGES/DRESSINGS) ×2 IMPLANT
DRESSING TELFA 8X10 (GAUZE/BANDAGES/DRESSINGS) IMPLANT
DRSG NASAL POPE 10X1.5X2.5 (GAUZE/BANDAGES/DRESSINGS) ×6
DRSG NASOPORE 8CM (GAUZE/BANDAGES/DRESSINGS) IMPLANT
ELECT REM PT RETURN 9FT ADLT (ELECTROSURGICAL)
ELECTRODE REM PT RTRN 9FT ADLT (ELECTROSURGICAL) IMPLANT
GLOVE BIO SURGEON STRL SZ7.5 (GLOVE) ×3 IMPLANT
GLOVE BIO SURGEON STRL SZ8 (GLOVE) ×2 IMPLANT
GLOVE BIOGEL PI IND STRL 8.5 (GLOVE) ×1 IMPLANT
GLOVE BIOGEL PI INDICATOR 8.5 (GLOVE) ×1
GOWN STRL REUS W/ TWL LRG LVL3 (GOWN DISPOSABLE) ×4 IMPLANT
GOWN STRL REUS W/TWL LRG LVL3 (GOWN DISPOSABLE) ×6
KIT BASIN OR (CUSTOM PROCEDURE TRAY) ×3 IMPLANT
KIT ROOM TURNOVER OR (KITS) ×3 IMPLANT
NDL HYPO 25GX1X1/2 BEV (NEEDLE) IMPLANT
NDL SPNL 22GX3.5 QUINCKE BK (NEEDLE) ×1 IMPLANT
NEEDLE 27GAX1X1/2 (NEEDLE) ×3 IMPLANT
NEEDLE HYPO 25GX1X1/2 BEV (NEEDLE) IMPLANT
NEEDLE SPNL 22GX3.5 QUINCKE BK (NEEDLE) ×3 IMPLANT
NS IRRIG 1000ML POUR BTL (IV SOLUTION) ×3 IMPLANT
PAD ARMBOARD 7.5X6 YLW CONV (MISCELLANEOUS) ×6 IMPLANT
PAD ENT ADHESIVE 25PK (MISCELLANEOUS) ×3 IMPLANT
PATTIES SURGICAL .5 X3 (DISPOSABLE) ×3 IMPLANT
SHEATH ENDOSCRUB 0 DEG (SHEATH) ×5 IMPLANT
SHEATH ENDOSCRUB 30 DEG (SHEATH) ×3 IMPLANT
SHEATH ENDOSCRUB 45 DEG (SHEATH) ×2 IMPLANT
SOLUTION ANTI FOG 6CC (MISCELLANEOUS) ×3 IMPLANT
SURGIFLO W/THROMBIN 8M KIT (HEMOSTASIS) ×2 IMPLANT
SUT ETHILON 3 0 PS 1 (SUTURE) IMPLANT
SUT MON AB 3-0 SH 27 (SUTURE) ×3
SUT MON AB 3-0 SH27 (SUTURE) ×1 IMPLANT
SWAB COLLECTION DEVICE MRSA (MISCELLANEOUS) IMPLANT
SYR 50ML SLIP (SYRINGE) IMPLANT
TOWEL OR 17X24 6PK STRL BLUE (TOWEL DISPOSABLE) ×3 IMPLANT
TRACKER ENT INSTRUMENT (MISCELLANEOUS) ×3 IMPLANT
TRACKER ENT PATIENT (MISCELLANEOUS) ×3 IMPLANT
TRAY ENT MC OR (CUSTOM PROCEDURE TRAY) ×3 IMPLANT
TUBE CONNECTING 12X1/4 (SUCTIONS) ×3 IMPLANT
TUBING STRAIGHTSHOT EPS 5PK (TUBING) IMPLANT
WATER STERILE IRR 1000ML POUR (IV SOLUTION) ×3 IMPLANT

## 2016-04-17 NOTE — Progress Notes (Signed)
Patient ID: [name], male   DOB: Oct 04, 1961, 55 y.o.   MRN: [id number] Doing well.  Some pain.  No active bleeding. AFVSS Alert, NAD. Left nasal passage packed, no active bleeding. Oral exam without injury. A/P: s/p left medial maxillectomy for inverting papilloma Observe overnight.  Likely discharge in morning with nose packed for a few days.

## 2016-04-17 NOTE — Brief Op Note (Addendum)
04/17/2016  12:36 PM  PATIENT:  [name]  55 y.o. male  PRE-OPERATIVE DIAGNOSIS:  Left nasal passage inverting papilloma  POST-OPERATIVE DIAGNOSIS:  same  PROCEDURE:  Procedure(s): LEFT MEDIAL MAXILLECTOMY VIA ENDOSCOPIC AND CALDWELL-LUC APPROACH WITH FUSION (Left)  SURGEON:  Surgeon(s) and Role:    * Melida Quitter, MD - Primary  PHYSICIAN ASSISTANT:   ASSISTANTS: none   ANESTHESIA:   general  EBL:  Total I/O In: 1700 [I.V.:1700] Out: 150 [Blood:150]  BLOOD ADMINISTERED:none  DRAINS: none   LOCAL MEDICATIONS USED:  LIDOCAINE   SPECIMEN:  Source of Specimen:  Left nasal passage mass  DISPOSITION OF SPECIMEN:  PATHOLOGY  COUNTS:  YES  TOURNIQUET:  * No tourniquets in log *  DICTATION: .Other Dictation: Dictation Number [id number]  PLAN OF CARE: Admit for overnight observation  PATIENT DISPOSITION:  PACU - hemodynamically stable.   Delay start of Pharmacological VTE agent (>24hrs) due to surgical blood loss or risk of bleeding: yes

## 2016-04-17 NOTE — Progress Notes (Signed)
Pt refused clear liquid diet stating 'that right there is for the birds'. Diet upgraded to regular as requested by pt

## 2016-04-17 NOTE — Anesthesia Preprocedure Evaluation (Addendum)
Anesthesia Evaluation  Patient identified by MRN, date of birth, ID band Patient awake    Reviewed: Allergy & Precautions, H&P , NPO status , Patient's Chart, lab work & pertinent test results  History of Anesthesia Complications Negative for: history of anesthetic complications  Airway Mallampati: II  TM Distance: >3 FB Neck ROM: full    Dental no notable dental hx.    Pulmonary Current Smoker,    Pulmonary exam normal breath sounds clear to auscultation       Cardiovascular hypertension, + Peripheral Vascular Disease  Normal cardiovascular exam Rhythm:regular Rate:Normal     Neuro/Psych  Headaches, Seizures -,  Depression Bipolar Disorder CVA    GI/Hepatic PUD, (+)     substance abuse  alcohol use, Hepatitis -  Endo/Other  negative endocrine ROS  Renal/GU negative Renal ROS     Musculoskeletal   Abdominal   Peds  Hematology negative hematology ROS (+)   Anesthesia Other Findings   Reproductive/Obstetrics negative OB ROS                            Anesthesia Physical Anesthesia Plan  ASA: II  Anesthesia Plan: General   Post-op Pain Management:    Induction: Intravenous  Airway Management Planned: Oral ETT  Additional Equipment:   Intra-op Plan:   Post-operative Plan: Extubation in OR  Informed Consent: I have reviewed the patients History and Physical, chart, labs and discussed the procedure including the risks, benefits and alternatives for the proposed anesthesia with the patient or authorized representative who has indicated his/her understanding and acceptance.   Dental Advisory Given  Plan Discussed with: Anesthesiologist, CRNA and Surgeon  Anesthesia Plan Comments:        Anesthesia Quick Evaluation

## 2016-04-17 NOTE — Anesthesia Postprocedure Evaluation (Signed)
Anesthesia Post Note  Patient: [name]  Procedure(s) Performed: Procedure(s) (LRB): LEFT MEDIAL MAXILLECTOMY VIA ENDOSCOPIC AND CALDWELL-LUC APPROACH WITH FUSION (Left)  Patient location during evaluation: PACU Anesthesia Type: General Level of consciousness: awake and alert Pain management: pain level controlled Vital Signs Assessment: post-procedure vital signs reviewed and stable Respiratory status: spontaneous breathing, nonlabored ventilation, respiratory function stable and patient connected to nasal cannula oxygen Cardiovascular status: blood pressure returned to baseline and stable Postop Assessment: no signs of nausea or vomiting Anesthetic complications: no    Last Vitals:  Filed Vitals:   04/17/16 1305 04/17/16 1313  BP: 151/92   Pulse: 70   Temp:  36.3 C  Resp: 14     Last Pain:  Filed Vitals:   04/17/16 1315  PainSc: 8                  Zenaida Deed

## 2016-04-17 NOTE — H&P (Signed)
[name] is an 55 y.o. male.   Chief Complaint: Left nasal passage inverting papilloma HPI: 55 year old male with left nasal passage inverting papilloma that was previously removed in the late 90s but has recurred in recent years.  The left nasal passage is fully obstructed by the mass.  He presents for surgical management.  Past Medical History  Diagnosis Date  . Alcohol abuse   . Hypertension   . Bipolar 1 disorder ([place])   . Peripheral vascular disease ([place])     ?"had blood clots in arms- no tx?"  . Pneumonia     hx  . Stomach ulcer   . Seizures ([place])     last time past 5 months  . Hepatitis     tx 80's  . Stroke [facility])     weakness rt hand    Past Surgical History  Procedure Laterality Date  . Brain tumor excision  90    nasal approach  . Hernia repair Left     Family History  Problem Relation Age of Onset  . Alcoholism Mother   . Alcoholism Father    Social History:  reports that he has been smoking Cigarettes.  He has a 50 pack-year smoking history. He does not have any smokeless tobacco history on file. He reports that he drinks about 7.2 oz of alcohol per week. He reports that he uses illicit drugs (Marijuana).  Allergies:  Allergies  Allergen Reactions  . Borax Anaphylaxis    SODIUM BORATE    Medications Prior to Admission  Medication Sig Dispense Refill  . acetaminophen-codeine (TYLENOL #4) 300-60 MG tablet Take 1 tablet by mouth every 4 (four) hours as needed for pain (tylenol with codeine #3).    . cloNIDine (CATAPRES) 0.1 MG tablet Take 1 tablet (0.1 mg total) by mouth 2 (two) times daily. 60 tablet 0  . fluticasone (FLONASE) 50 MCG/ACT nasal spray Place 1 spray into both nostrils daily as needed for allergies.     Marland Kitchen lisinopril-hydrochlorothiazide (PRINZIDE,ZESTORETIC) 20-12.5 MG tablet Take 1 tablet by mouth daily.    . naproxen (NAPROSYN) 500 MG tablet Take 500 mg by mouth 3 (three) times daily with meals.    . phenytoin (DILANTIN) 100 MG ER  capsule Take 100 mg by mouth 2 (two) times daily.    . sildenafil (REVATIO) 20 MG tablet Take 20 mg by mouth daily.    Marland Kitchen aspirin 81 MG tablet Take 1 tablet (81 mg total) by mouth daily. (Patient not taking: Reported on 04/15/2016) 30 tablet 5  . atorvastatin (LIPITOR) 40 MG tablet Take 1 tablet (40 mg total) by mouth daily. (Patient not taking: Reported on 04/17/2016) 90 tablet 3    Results for orders placed or performed during the hospital encounter of 04/15/16 (from the past 48 hour(s))  APTT     Status: None   Collection Time: 04/15/16  1:33 PM  Result Value Ref Range   aPTT 34 24 - 37 seconds  Protime-INR     Status: None   Collection Time: 04/15/16  1:33 PM  Result Value Ref Range   Prothrombin Time 13.5 11.6 - 15.2 seconds   INR 1.01 0.00 - 1.49  Comprehensive metabolic panel     Status: Abnormal   Collection Time: 04/15/16  1:33 PM  Result Value Ref Range   Sodium 136 135 - 145 mmol/L   Potassium 4.1 3.5 - 5.1 mmol/L   Chloride 106 101 - 111 mmol/L   CO2 24 22 -  32 mmol/L   Glucose, Bld 86 65 - 99 mg/dL   BUN 13 6 - 20 mg/dL   Creatinine, Ser 0.88 0.61 - 1.24 mg/dL   Calcium 8.7 (L) 8.9 - 10.3 mg/dL   Total Protein 7.3 6.5 - 8.1 g/dL   Albumin 3.4 (L) 3.5 - 5.0 g/dL   AST 18 15 - 41 U/L   ALT 13 (L) 17 - 63 U/L   Alkaline Phosphatase 68 38 - 126 U/L   Total Bilirubin 0.6 0.3 - 1.2 mg/dL   GFR calc non Af Amer >60 >60 mL/min   GFR calc Af Amer >60 >60 mL/min    Comment: (NOTE) The eGFR has been calculated using the CKD EPI equation. This calculation has not been validated in all clinical situations. eGFR's persistently <60 mL/min signify possible Chronic Kidney Disease.    Anion gap 6 5 - 15  CBC     Status: None   Collection Time: 04/15/16  1:33 PM  Result Value Ref Range   WBC 8.0 4.0 - 10.5 K/uL   RBC 4.55 4.22 - 5.81 MIL/uL   Hemoglobin 13.6 13.0 - 17.0 g/dL   HCT 42.4 39.0 - 52.0 %   MCV 93.2 78.0 - 100.0 fL   MCH 29.9 26.0 - 34.0 pg   MCHC 32.1 30.0 - 36.0  g/dL   RDW 13.1 11.5 - 15.5 %   Platelets 194 150 - 400 K/uL   No results found.  Review of Systems  HENT: Positive for congestion.   All other systems reviewed and are negative.   Blood pressure 130/86, pulse 65, temperature 97.8 F (36.6 C), resp. rate 20, height 5' 6.5" (1.689 m), weight 70.308 kg (155 lb), SpO2 100 %. Physical Exam  Constitutional: He is oriented to person, place, and time. He appears well-developed and well-nourished. No distress.  HENT:  Head: Normocephalic and atraumatic.  Right Ear: External ear normal.  Left Ear: External ear normal.  Mouth/Throat: Oropharynx is clear and moist.  Left nasal passage obstructed by soft tissue mass widening the external nose.  Eyes: Conjunctivae and EOM are normal. Pupils are equal, round, and reactive to light.  Neck: Normal range of motion. Neck supple.  Cardiovascular: Normal rate.   Respiratory: Effort normal.  Musculoskeletal: Normal range of motion.  Neurological: He is alert and oriented to person, place, and time. No cranial nerve deficit.  Skin: Skin is warm and dry.  Psychiatric: He has a normal mood and affect. His behavior is normal. Judgment and thought content normal.     Assessment/Plan Left nasal passage inverting papilloma To OR for endoscopic left medial maxillectomy.  Plan overnight observation after surgery.  Melida Quitter, MD 04/17/2016, 10:40 AM

## 2016-04-17 NOTE — Addendum Note (Signed)
Addendum  created 04/17/16 1418 by Jillyn Hidden, MD   Modules edited: Orders

## 2016-04-17 NOTE — Progress Notes (Signed)
Nicotine patch administered as ordered by MD. 2mg  iv morphine also given for c/o pain. Pt sleeping at this time

## 2016-04-17 NOTE — Transfer of Care (Signed)
Immediate Anesthesia Transfer of Care Note  Patient: [name]  Procedure(s) Performed: Procedure(s): LEFT MEDIAL MAXILLECTOMY VIA ENDOSCOPIC AND CALDWELL-LUC APPROACH WITH FUSION (Left)  Patient Location: PACU  Anesthesia Type:MAC  Level of Consciousness: awake, alert , patient cooperative and responds to stimulation  Airway & Oxygen Therapy: Patient Spontanous Breathing and Patient connected to face mask oxygen  Post-op Assessment: Report given to RN, Post -op Vital signs reviewed and stable and Patient moving all extremities X 4  Post vital signs: Reviewed and stable  Last Vitals:  Filed Vitals:   04/17/16 0807  BP: 130/86  Pulse: 65  Temp: 36.6 C  Resp: 20    Last Pain:  Filed Vitals:   04/17/16 0815  PainSc: 10-Worst pain ever      Patients Stated Pain Goal: 8 ([phone] [id number])  Complications: No apparent anesthesia complications

## 2016-04-17 NOTE — Anesthesia Procedure Notes (Signed)
Procedure Name: Intubation Date/Time: 04/17/2016 10:58 AM Performed by: Tressia Miners LEFFEW Pre-anesthesia Checklist: Patient identified, Patient being monitored, Timeout performed, Emergency Drugs available and Suction available Patient Re-evaluated:Patient Re-evaluated prior to inductionOxygen Delivery Method: Circle System Utilized Preoxygenation: Pre-oxygenation with 100% oxygen Intubation Type: IV induction Ventilation: Mask ventilation without difficulty Laryngoscope Size: Mac and 3 Grade View: Grade II Tube type: Oral Tube size: 7.5 mm Number of attempts: 1 Airway Equipment and Method: Stylet Placement Confirmation: ETT inserted through vocal cords under direct vision,  positive ETCO2 and breath sounds checked- equal and bilateral Secured at: 23 cm Tube secured with: Tape Dental Injury: Teeth and Oropharynx as per pre-operative assessment

## 2016-04-17 NOTE — Progress Notes (Signed)
Pt is adamant that he needs to leave the unit and go smoke a cigarette. Advised that pt cannot smoke while in hospital and that MD will be paged and notified .

## 2016-04-18 DIAGNOSIS — D140 Benign neoplasm of middle ear, nasal cavity and accessory sinuses: Secondary | ICD-10-CM | POA: Diagnosis not present

## 2016-04-18 MED ORDER — HYDROCODONE-ACETAMINOPHEN 5-325 MG PO TABS: 1.0000 | ORAL_TABLET | ORAL | Status: DC | PRN

## 2016-04-18 MED ORDER — CEPHALEXIN 500 MG PO CAPS: 500.0000 mg | ORAL_CAPSULE | Freq: Three times a day (TID) | ORAL | Status: DC

## 2016-04-18 NOTE — Progress Notes (Signed)
Pt discharged to home self care.  DC instructions given and reviewed.

## 2016-04-18 NOTE — Discharge Summary (Signed)
Physician Discharge Summary  Patient ID: [name] MRN: [id number] DOB/AGE: 11-Jan-1961 55 y.o.  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  He had recurrence of the tumor in recent years and presented to the hospital for surgical management.  See operative note.  Packs were left in the left side of the nose after surgery.  He was observed overnight and did quite well.  He is felt stable for discharge on POD 1.  Consults: None  Significant Diagnostic Studies: None  Treatments: surgery: Left medial maxillectomy via combined approach  Discharge Exam: Blood pressure 125/73, pulse 65, temperature 98.3 F (36.8 C), temperature source Oral, resp. rate 19, height 5' 6.5" (1.689 m), weight 70.308 kg (155 lb), SpO2 100 %. General appearance: alert, cooperative and no distress Nose: Left nasal passage packs in place, no bleeding.  Disposition: 01-Home or Self Care  Discharge Instructions    Diet - low sodium heart healthy    Complete by:  As directed      Discharge instructions    Complete by:  As directed   Apply saline spray to left-sided pack every 2-4 hours while awake.  Avoid nose blowing.  Normal diet and activity.     Increase activity slowly    Complete by:  As directed             Medication List    STOP taking these medications        acetaminophen-codeine 300-60 MG tablet  Commonly known as:  TYLENOL #4     fluticasone 50 MCG/ACT nasal spray  Commonly known as:  FLONASE      TAKE these medications        aspirin 81 MG tablet  Take 1 tablet (81 mg total) by mouth daily.     atorvastatin 40 MG tablet  Commonly known as:  LIPITOR  Take 1 tablet (40 mg  total) by mouth daily.     cephALEXin 500 MG capsule  Commonly known as:  KEFLEX  Take 1 capsule (500 mg total) by mouth 3 (three) times daily.     cloNIDine 0.1 MG tablet  Commonly known as:  CATAPRES  Take 1 tablet (0.1 mg total) by mouth 2 (two) times daily.     HYDROcodone-acetaminophen 5-325 MG tablet  Commonly known as:  NORCO/VICODIN  Take 1-2 tablets by mouth every 4 (four) hours as needed for moderate pain.     lisinopril-hydrochlorothiazide 20-12.5 MG tablet  Commonly known as:  PRINZIDE,ZESTORETIC  Take 1 tablet by mouth daily.     naproxen 500 MG tablet  Commonly known as:  NAPROSYN  Take 500 mg by mouth 3 (three) times daily with meals.     phenytoin 100 MG ER capsule  Commonly known as:  DILANTIN  Take 100 mg by mouth 2 (two) times daily.     sildenafil 20 MG tablet  Commonly known as:  REVATIO  Take 20 mg by mouth daily.           Follow-up Information    Follow up with Aedyn Mckeon, MD. Schedule an appointment as soon as possible for a visit on 04/20/2016.   Specialty:  Otolaryngology   Contact information:   [place]  [address] [place] [address] [phone]       Signed: Melida Quitter 04/18/2016, 8:51 AM

## 2016-04-18 NOTE — Op Note (Signed)
NAME:  [name], [name] NO.:  [id number]  MEDICAL RECORD NO.:  [id number]  LOCATION:  [id number]                        FACILITY:  [facility]  PHYSICIAN:  Onnie Graham, MD     DATE OF BIRTH:  1960-11-07  DATE OF PROCEDURE:  04/17/2016 DATE OF DISCHARGE:                              OPERATIVE REPORT   PREOPERATIVE DIAGNOSIS:  Left nasal passage inverting papilloma.  POSTOPERATIVE DIAGNOSIS:  Left nasal passage inverting papilloma.  PROCEDURE:  Left medial maxillectomy via endoscopic and Caldwell lock approaches with fusion image guidance.  SURGEON:  Onnie Graham, M.D.  ANESTHESIA:  General endotracheal anesthesia.  COMPLICATIONS:  None.  INDICATION:  The patient is a 55 year old male with a past history of a left sided inverting papilloma removed in the late 1990s.  In recent years, he has had recurrence of symptoms with an obstructing mass developing in the left nasal passage that extended up the front of the nose.  Biopsy confirmed continued benign inverting papilloma and presents to the operating room for surgical management.  FINDINGS:  There was a papular mass filling the entire left nasal passage including extending into the vestibule.  The tumor involves the lateral nasal wall corresponding to the medial maxillary wall.  The tumor fully filled the maxillary sinus and was attached within the maxillary sinus as well.  The tumor did not extend into the ethmoid or middle turbinate region and the septum was intact.  Removal of the tumor included removal of the inferior turbinate, medial maxillary wall and total stripping of the maxillary sinus.  DESCRIPTION OF PROCEDURE:  The patient was identified in the holding room and informed consent having been obtained including discussion of risks, benefits, alternatives, the patient was brought to the operative suite, and put on the operating table in supine position.  Anesthesia was induced.  The patient was  intubated by the Anesthesia team without difficulty.  The patient received intravenous antibiotics during the case.  The eyes were lubricated and the fusion image guidance antenna was attached to the forehead.  The face was prepped and draped in sterile fashion.  The patient was registered to the fusion system in standard fashion.  The eyes were taped closed with Steri-Strips.  Afrin- soaked pledgets were placed on both sides of the nose for several minutes.  The anterior extent of the left nasal passage mass was injected with local anesthetic and resection was begun by using a Thre- Cut forceps to break through some of the front part of the mass and then further removal with microdebrider.  This continued into the nasal passage keeping the nasal septum and lateral nasal wall intact. Dissection continued back behind the head of the inferior turbinate where the mass appeared to be involving the where the superior portion of the inferior turbinate would be.  Further removal of the mass was then performed with a debrider back to the nasal passage keeping the mass pedicled within the maxillary and in the lateral nasal wall.  The mass was removed out of the anterior ethmoid region  INCOMPLETE DICTATION     Onnie Graham, MD     DDB/MEDQ  D:  04/17/2016  T:  04/18/2016  Job:  [id number]

## 2016-04-20 ENCOUNTER — Encounter (HOSPITAL_COMMUNITY): Payer: Self-pay | Admitting: Otolaryngology

## 2016-04-20 NOTE — Op Note (Signed)
NAME:  [name], [name] NO.:  [id number]  MEDICAL RECORD NO.:  [id number]  LOCATION:                                 FACILITY:  PHYSICIAN:  Onnie Graham, MD     DATE OF BIRTH:  02/09/1961  DATE OF PROCEDURE:  04/17/2016 DATE OF DISCHARGE:                              OPERATIVE REPORT   ADDENDUM  At this point, an angled telescope was used to evaluate the lateral nasal wall.  A curved microdebrider was used to further dissect or remove tumor from the lateral nasal wall region.  It became apparent that tumor had to be exposed anteriorly in order to be fully removed. So, the telescope was removed and the left superior gingivobuccal sulcus was injected with 1% lidocaine with 1:000,000 epinephrine.  An incision was made with the Bovie electrocautery superior to the gingiva and extended to submucosal tissues down to the maxilla.  Soft tissue was then elevated off the maxilla toward the inferior orbital rim.  The infraorbital nerve was not encountered.  With maxilla exposed, an osteotome was used to make a rectangular-shaped osteotomy using the mallet and the segment of bone was removed with tumor attached on the undersurface.  At this point, the maxillary sinus was stripped fully using a curved curette, Freer elevator, and suction until all of soft tissue was removed from the maxillary sinus including all the mucosa. All this tissue was passed to nursing for pathology.  At this point, the nasal passage was again inspected with a 0-degree telescope and suctioned.  A few additional places were removed with microdebrider.  At this point, the tumor was found to be fully removed.  Afrin pledgets were placed in the left nasal passage and left in for several minutes. The gingivobuccal sulcus incision was closed with 3-0 Monocryl in simple running fashion.  Pledgets were removed and the raw edges in the sinuses were then covered in Surgiflo.  A nasal passage and throat were  then suctioned.  He was returned to anesthesia for wake-up.  During the beginning of wake up, he bled heavily from the nose and so 2 10 cm Merocel packs were placed in the left nasal passage and Bactroban ointment and then saturated with saline.  He was then allowed to be woken and was extubated, moved to recovery room in stable condition.     Onnie Graham, MD   ______________________________ Onnie Graham, MD    DDB/MEDQ  D:  04/17/2016  T:  04/18/2016  Job:  [id number]

## 2016-05-12 ENCOUNTER — Emergency Department (HOSPITAL_COMMUNITY): Payer: Medicaid Other

## 2016-05-12 ENCOUNTER — Emergency Department (HOSPITAL_COMMUNITY)
Admission: EM | Admit: 2016-05-12 | Discharge: 2016-05-12 | Disposition: A | Discharge status: Home / Self Care | Payer: Medicaid Other | Attending: Emergency Medicine | Admitting: Emergency Medicine

## 2016-05-12 ENCOUNTER — Encounter (HOSPITAL_COMMUNITY): Payer: Self-pay | Admitting: Emergency Medicine

## 2016-05-12 DIAGNOSIS — F319 Bipolar disorder, unspecified: Secondary | ICD-10-CM | POA: Diagnosis not present

## 2016-05-12 DIAGNOSIS — Z7982 Long term (current) use of aspirin: Secondary | ICD-10-CM | POA: Diagnosis not present

## 2016-05-12 DIAGNOSIS — Z79899 Other long term (current) drug therapy: Secondary | ICD-10-CM | POA: Diagnosis not present

## 2016-05-12 DIAGNOSIS — I10 Essential (primary) hypertension: Secondary | ICD-10-CM | POA: Insufficient documentation

## 2016-05-12 DIAGNOSIS — R1013 Epigastric pain: Secondary | ICD-10-CM

## 2016-05-12 DIAGNOSIS — Z8673 Personal history of transient ischemic attack (TIA), and cerebral infarction without residual deficits: Secondary | ICD-10-CM | POA: Insufficient documentation

## 2016-05-12 DIAGNOSIS — F17210 Nicotine dependence, cigarettes, uncomplicated: Secondary | ICD-10-CM | POA: Insufficient documentation

## 2016-05-12 DIAGNOSIS — K2970 Gastritis, unspecified, without bleeding: Secondary | ICD-10-CM | POA: Insufficient documentation

## 2016-05-12 LAB — BASIC METABOLIC PANEL
ANION GAP: 8 (ref 5–15)
BUN: 10 mg/dL (ref 6–20)
CALCIUM: 9.1 mg/dL (ref 8.9–10.3)
CO2: 25 mmol/L (ref 22–32)
CREATININE: 0.91 mg/dL (ref 0.61–1.24)
Chloride: 103 mmol/L (ref 101–111)
GLUCOSE: 141 mg/dL — AB (ref 65–99)
Potassium: 3.7 mmol/L (ref 3.5–5.1)
Sodium: 136 mmol/L (ref 135–145)

## 2016-05-12 LAB — CBC
HCT: 41.7 % (ref 39.0–52.0)
HEMOGLOBIN: 13.2 g/dL (ref 13.0–17.0)
MCH: 29.7 pg (ref 26.0–34.0)
MCHC: 31.7 g/dL (ref 30.0–36.0)
MCV: 93.7 fL (ref 78.0–100.0)
PLATELETS: 185 10*3/uL (ref 150–400)
RBC: 4.45 MIL/uL (ref 4.22–5.81)
RDW: 13.6 % (ref 11.5–15.5)
WBC: 10.8 10*3/uL — ABNORMAL HIGH (ref 4.0–10.5)

## 2016-05-12 LAB — I-STAT TROPONIN, ED: TROPONIN I, POC: 0 ng/mL (ref 0.00–0.08)

## 2016-05-12 LAB — CK: Total CK: 184 U/L (ref 49–397)

## 2016-05-12 MED ORDER — HYDROMORPHONE HCL 1 MG/ML IJ SOLN
1.0000 mg | INTRAMUSCULAR | Status: DC | PRN
  Administered 2016-05-12: 1 mg via INTRAVENOUS
  Filled 2016-05-12: qty 1

## 2016-05-12 MED ORDER — ONDANSETRON HCL 4 MG/2ML IJ SOLN
4.0000 mg | Freq: Once | INTRAMUSCULAR | Status: AC
  Administered 2016-05-12: 4 mg via INTRAVENOUS
  Filled 2016-05-12: qty 2

## 2016-05-12 MED ORDER — FAMOTIDINE IN NACL 20-0.9 MG/50ML-% IV SOLN
20.0000 mg | Freq: Two times a day (BID) | INTRAVENOUS | Status: DC
  Administered 2016-05-12: 20 mg via INTRAVENOUS
  Filled 2016-05-12: qty 50

## 2016-05-12 MED ORDER — ONDANSETRON 4 MG PO TBDP: 4.0000 mg | ORAL_TABLET | Freq: Three times a day (TID) | ORAL | 0 refills | Status: DC | PRN

## 2016-05-12 MED ORDER — OMEPRAZOLE 20 MG PO CPDR: 20.0000 mg | DELAYED_RELEASE_CAPSULE | Freq: Two times a day (BID) | ORAL | 0 refills | Status: DC

## 2016-05-12 MED ORDER — SUCRALFATE 1 G PO TABS: 1.0000 g | ORAL_TABLET | Freq: Four times a day (QID) | ORAL | 0 refills | Status: DC

## 2016-05-12 MED ORDER — HYDROCODONE-ACETAMINOPHEN 5-325 MG PO TABS: 2.0000 | ORAL_TABLET | ORAL | 0 refills | Status: DC | PRN

## 2016-05-12 NOTE — ED Notes (Signed)
D/C instructions reviewed with patient and he verbalizes understanding and denies any more questions and sts that he is going to follow the gastritis instructions so he doesn't have any more problems

## 2016-05-12 NOTE — ED Notes (Signed)
Pt transported to CT ?

## 2016-05-12 NOTE — ED Provider Notes (Signed)
[facility] DEPT Provider Note   CSN: [id number] Arrival date & time: 05/12/16  1244  First Provider Contact:  None       History   Chief Complaint Chief Complaint  Patient presents with  . Chest Pain  . Emesis  . Extremity Weakness    HPI [name] is a 55 y.o. male. He describes epigastric pain for the last few days. Sharp and burning. Nauseating to him. No pain in his chest. No diarrhea. No emesis but nausea. States he drinks 1 or 2 beers per day. States he used to drink heavier "until Saturday" his symptoms started Friday. He smokes daily. He drinks caffeine daily. 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 No black stools or bloody stools. No history of gastritis or ulcer per the patient's report, however it is listed in his past medical record  HPI  Past Medical History:  Diagnosis Date  . Alcohol abuse   . Bipolar 1 disorder ([place])   . Hepatitis    tx 80's  . Hypertension   . Peripheral vascular disease ([place])    ?"had blood clots in arms- no tx?"  . Pneumonia    hx  . Seizures ([place])    last time past 5 months  . Stomach ulcer   . Stroke [facility])    weakness rt hand    Patient Active Problem List   Diagnosis Date Noted  . 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 Cerebral infarct ([place]) 10/29/2015  . Erectile dysfunction 10/28/2015  . Hypertension 10/28/2015  . Plantar fasciitis of right foot 10/28/2015  . Alcohol use disorder, severe, dependence ([place]) 10/25/2015  . 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 MDD (major depressive disorder), recurrent episode, severe ([place]) 10/25/2015  . Vitamin D deficiency 04/15/2015  . 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 Lipoma of flank 04/12/2015  . Seizures ([place]) 04/12/2015  . Persistent headaches 04/12/2015  . Horizontal nystagmus 04/12/2015  . Hypocalcemia 04/12/2015    Past Surgical History:    Procedure Laterality Date  . BRAIN TUMOR EXCISION  90   nasal approach  . HERNIA REPAIR Left   . SINUS ENDO WITH FUSION Left 04/17/2016   Procedure: LEFT MEDIAL MAXILLECTOMY VIA ENDOSCOPIC AND CALDWELL-LUC APPROACH WITH FUSION;  Surgeon: Melida Quitter, MD;  Location: [facility];  Service: ENT;  Laterality: Left;       Home Medications    Prior to Admission medications   Medication Sig Start Date End Date Taking? Authorizing Provider  cephALEXin (KEFLEX) 500 MG capsule Take 1 capsule (500 mg total) by mouth 3 (three) times daily. 04/18/16  Yes Melida Quitter, MD  cloNIDine (CATAPRES) 0.1 MG tablet Take 1 tablet (0.1 mg total) by mouth 2 (two) times daily. 10/28/15  Yes Josalyn Funches, MD  lisinopril-hydrochlorothiazide (PRINZIDE,ZESTORETIC) 20-12.5 MG tablet Take 1 tablet by mouth daily.   Yes Historical Provider, MD  naproxen (NAPROSYN) 500 MG tablet Take 500 mg by mouth 3 (three) times daily with meals.   Yes Historical Provider, MD  phenytoin (DILANTIN) 100 MG ER capsule Take 100 mg by mouth 2 (two) times daily.   Yes Historical Provider, MD  sildenafil (REVATIO) 20 MG tablet Take 20 mg by mouth daily.   Yes Historical Provider, MD  aspirin 81 MG tablet Take 1 tablet (81 mg total) by mouth daily. Patient not taking: Reported on 04/15/2016 10/29/15  [name], MD  atorvastatin (LIPITOR) 40 MG tablet Take 1 tablet (40 mg total) by mouth daily. Patient not taking: Reported on 04/17/2016 10/29/15   [name], MD  HYDROcodone-acetaminophen (NORCO/VICODIN) 5-325 MG tablet Take 2 tablets by mouth every 4 (four) hours as needed. 05/12/16   Tanna Furry, MD  omeprazole (PRILOSEC) 20 MG capsule Take 1 capsule (20 mg total) by mouth 2 (two) times daily. 05/12/16   Tanna Furry, MD  ondansetron (ZOFRAN ODT) 4 MG disintegrating tablet Take 1 tablet (4 mg total) by mouth every 8 (eight) hours as needed for nausea. 05/12/16   Tanna Furry, MD  sucralfate (CARAFATE) 1 g tablet Take 1 tablet (1 g total) by mouth 4  (four) times daily. 05/12/16   Tanna Furry, MD    Family History Family History  Problem Relation Age of Onset  . Alcoholism Mother   . Alcoholism Father     Social History Social History  Substance Use Topics  . Smoking status: Current Some Day Smoker    Packs/day: 2.00    Years: 25.00    Types: Cigarettes  . Smokeless tobacco: Current User  . Alcohol use 7.2 oz/week    12 Cans of beer per week     Allergies   Borax   Review of Systems Review of Systems  Constitutional: Negative for appetite change, chills, diaphoresis, fatigue and fever.  HENT: Negative for mouth sores, sore throat and trouble swallowing.   Eyes: Negative for visual disturbance.  Respiratory: Negative for cough, chest tightness, shortness of breath and wheezing.   Cardiovascular: Negative for chest pain.  Gastrointestinal: Positive for abdominal pain and nausea. Negative for abdominal distention, diarrhea and vomiting.  Endocrine: Negative for polydipsia, polyphagia and polyuria.  Genitourinary: Negative for dysuria, frequency and hematuria.  Musculoskeletal: Negative for gait problem.  Skin: Negative for color change, pallor and rash.  Neurological: Negative for dizziness, syncope, light-headedness and headaches.  Hematological: Does not bruise/bleed easily.  Psychiatric/Behavioral: Negative for behavioral problems and confusion.     Physical Exam Updated Vital Signs BP 123/90   Pulse (!) 56   Temp 98.3 F (36.8 C) (Oral)   Resp 15   Ht 5\' 5"  (1.651 m)   Wt 160 lb (72.6 kg)   SpO2 98%   BMI 26.63 kg/m   Physical Exam  Constitutional: He is oriented to person, place, and time. He appears well-developed and well-nourished. No distress.  HENT:  Head: Normocephalic.  Conjunctiva not pale. No scleral icterus.  Eyes: Conjunctivae are normal. Pupils are equal, round, and reactive to light. No scleral icterus.  Neck: Normal range of motion. Neck supple. No thyromegaly present.  Cardiovascular:  Normal rate and regular rhythm.  Exam reveals no gallop and no friction rub.   No murmur heard. Pulmonary/Chest: Effort normal and breath sounds normal. No respiratory distress. He has no wheezes. He has no rales.  Abdominal: Soft. Bowel sounds are normal. He exhibits no distension. There is no tenderness. There is no rebound.  Abdomen is soft. Pain is directly inferior to the xiphoid in the epigastrium. No guarding rebound or peritoneal irritation.  Musculoskeletal: Normal range of motion.  Neurological: He is alert and oriented to person, place, and time.  Skin: Skin is warm and dry. No rash noted.  Psychiatric: He has a normal mood and affect. His behavior is normal.     ED Treatments / Results  Labs (all labs ordered are listed, but only abnormal results are displayed) Labs Reviewed  BASIC METABOLIC  PANEL - Abnormal; Notable for the following:       Result Value   Glucose, Bld 141 (*)    All other components within normal limits  CBC - Abnormal; Notable for the following:    WBC 10.8 (*)    All other components within normal limits  CK  I-STAT TROPOININ, ED    EKG  EKG Interpretation  Date/Time:  Tuesday May 12 2016 12:49:51 EDT Ventricular Rate:  62 PR Interval:  128 QRS Duration: 80 QT Interval:  400 QTC Calculation: 406 R Axis:   50 Text Interpretation:  Normal sinus rhythm Normal ECG Confirmed by Jeneen Rinks  MD, [place] ([id number]) on 05/12/2016 3:01:04 PM       Radiology Dg Chest 2 View  Result Date: 05/12/2016 CLINICAL DATA:  55 year old male with a history of chest pain EXAM: CHEST  2 VIEW COMPARISON:  Chest x-ray 06/14/2010, 04/27/2010 FINDINGS: Cardiomediastinal silhouette unchanged. Calcifications of the aortic arch. No pneumothorax, pleural effusion, or confluent airspace disease. No displaced fracture. Soft tissue contour abnormality at the lower left chest, potentially a lipoma or sebaceous cyst. IMPRESSION: No radiographic evidence of acute cardiopulmonary disease.  Aortic atherosclerosis Signed, Dulcy Fanny. Earleen Newport, DO [facility] Radiology Electronically Signed   By: Corrie Mckusick D.O.   On: 05/12/2016 13:27   Ct Renal Stone Study  Result Date: 05/12/2016 CLINICAL DATA:  Chest pain for 4 days. Vomiting and leg weakness. Dysuria. Back pain. EXAM: CT ABDOMEN AND PELVIS WITHOUT CONTRAST TECHNIQUE: Multidetector CT imaging of the abdomen and pelvis was performed following the standard protocol without IV contrast. COMPARISON:  04/27/2010 FINDINGS: Lower chest:  Unremarkable Hepatobiliary: Unremarkable.  Gallbladder normal. Pancreas: Unremarkable Spleen: Unremarkable Adrenals/Urinary Tract: Questionable 1 mm right kidney upper pole nonobstructive calculus, image 59/5. No hydronephrosis, hydroureter, or visibly ureteral calculus. Urinary bladder unremarkable. Th Stomach/Bowel: Unremarkable.  Appendix normal. Vascular/Lymphatic: Aortoiliac atherosclerotic vascular disease. Reproductive: Prostate gland measures approximately 5.0 by 5.1 by 4.4 cm (volume = 58.3 cc). Other: Subcutaneous cystic lesion along the left flank measures 6.8 by 2.2 by 5.0 cm (volume = 38.9 cc), and similar in appearance to the prior exam aside from being slightly larger. Musculoskeletal: Mild spurring anteriorly along the sacroiliac joints. Mild disc bulge at L4-5. IMPRESSION: 1. A specific cause for the patient's at chest and abdominal pain is not observed. 2. Questionable 1 mm right kidney upper pole nonobstructive calculus. 3.  Aortoiliac atherosclerotic vascular disease. 4. Mildly enlarged prostate gland. 5. Subcutaneous cystic lesion along the left flank measuring about 39 cc in volume, slightly larger but otherwise similar appearance to 04/27/2010, possibly a sebaceous cyst. 6. Mild disc bulge at L4-5. Electronically Signed   By: Van Clines M.D.   On: 05/12/2016 15:55    Procedures Procedures (including critical care time)  Medications Ordered  in ED Medications  HYDROmorphone (DILAUDID) injection 1 mg (1 mg Intravenous Given 05/12/16 1531)  famotidine (PEPCID) IVPB 20 mg premix (0 mg Intravenous Stopped 05/12/16 1630)  ondansetron (ZOFRAN) injection 4 mg (4 mg Intravenous Given 05/12/16 1531)     Initial Impression / Assessment and Plan / ED Course  I have reviewed the triage vital signs and the nursing notes.  Pertinent labs & imaging results that were available during my care of the patient were reviewed by me and considered in my medical decision making (see chart for details).  Clinical Course    EKG normal. CT of the abdomen obtained to ensure this was not perforation. No sign of  perforation otherwise normal. Symptoms consistent with gastritis or acid related phenomenon such as gastric ulcer duodenal ulcer duodenitis. Given IV Pepcid and pain medications and symptoms are improving. Discharge home Vicodin, Zofran, Prilosec, Carafate and avoid Alkol tobacco antibiotic course caffeine primary care follow-up. ER with worsening.  Final Clinical Impressions(s) / ED Diagnoses   Final diagnoses:  Epigastric pain  Gastritis    New Prescriptions New Prescriptions   HYDROCODONE-ACETAMINOPHEN (NORCO/VICODIN) 5-325 MG TABLET    Take 2 tablets by mouth every 4 (four) hours as needed.   OMEPRAZOLE (PRILOSEC) 20 MG CAPSULE    Take 1 capsule (20 mg total) by mouth 2 (two) times daily.   ONDANSETRON (ZOFRAN ODT) 4 MG DISINTEGRATING TABLET    Take 1 tablet (4 mg total) by mouth every 8 (eight) hours as needed for nausea.   SUCRALFATE (CARAFATE) 1 G TABLET    Take 1 tablet (1 g total) by mouth 4 (four) times daily.     Tanna Furry, MD 05/12/16 1800

## 2016-05-12 NOTE — ED Triage Notes (Signed)
Pt. Stated, I've had chest pain for 4 days with some vomiting and some leg weakness

## 2016-05-15 ENCOUNTER — Emergency Department (HOSPITAL_COMMUNITY)
Admission: EM | Admit: 2016-05-15 | Discharge: 2016-05-15 | Disposition: A | Discharge status: Home / Self Care | Payer: Medicaid Other | Attending: Emergency Medicine | Admitting: Emergency Medicine

## 2016-05-15 ENCOUNTER — Encounter (HOSPITAL_COMMUNITY): Payer: Self-pay | Admitting: Emergency Medicine

## 2016-05-15 ENCOUNTER — Emergency Department (HOSPITAL_COMMUNITY): Payer: Medicaid Other

## 2016-05-15 DIAGNOSIS — F17210 Nicotine dependence, cigarettes, uncomplicated: Secondary | ICD-10-CM | POA: Diagnosis not present

## 2016-05-15 DIAGNOSIS — R112 Nausea with vomiting, unspecified: Secondary | ICD-10-CM

## 2016-05-15 DIAGNOSIS — F1290 Cannabis use, unspecified, uncomplicated: Secondary | ICD-10-CM | POA: Insufficient documentation

## 2016-05-15 DIAGNOSIS — K2970 Gastritis, unspecified, without bleeding: Secondary | ICD-10-CM | POA: Diagnosis not present

## 2016-05-15 DIAGNOSIS — I10 Essential (primary) hypertension: Secondary | ICD-10-CM | POA: Diagnosis not present

## 2016-05-15 DIAGNOSIS — Z7982 Long term (current) use of aspirin: Secondary | ICD-10-CM | POA: Diagnosis not present

## 2016-05-15 DIAGNOSIS — F17290 Nicotine dependence, other tobacco product, uncomplicated: Secondary | ICD-10-CM | POA: Insufficient documentation

## 2016-05-15 DIAGNOSIS — Z792 Long term (current) use of antibiotics: Secondary | ICD-10-CM | POA: Insufficient documentation

## 2016-05-15 DIAGNOSIS — Z79899 Other long term (current) drug therapy: Secondary | ICD-10-CM | POA: Diagnosis not present

## 2016-05-15 DIAGNOSIS — Z8673 Personal history of transient ischemic attack (TIA), and cerebral infarction without residual deficits: Secondary | ICD-10-CM | POA: Insufficient documentation

## 2016-05-15 LAB — COMPREHENSIVE METABOLIC PANEL
ALK PHOS: 67 U/L (ref 38–126)
ALT: 12 U/L — ABNORMAL LOW (ref 17–63)
AST: 19 U/L (ref 15–41)
Albumin: 3.9 g/dL (ref 3.5–5.0)
Anion gap: 7 (ref 5–15)
BILIRUBIN TOTAL: 0.4 mg/dL (ref 0.3–1.2)
BUN: 14 mg/dL (ref 6–20)
CALCIUM: 9.2 mg/dL (ref 8.9–10.3)
CO2: 27 mmol/L (ref 22–32)
CREATININE: 0.95 mg/dL (ref 0.61–1.24)
Chloride: 107 mmol/L (ref 101–111)
GFR calc Af Amer: >60 mL/min (ref >60)
Glucose, Bld: 104 mg/dL — ABNORMAL HIGH (ref 65–99)
POTASSIUM: 3.8 mmol/L (ref 3.5–5.1)
Sodium: 141 mmol/L (ref 135–145)
TOTAL PROTEIN: 7.6 g/dL (ref 6.5–8.1)

## 2016-05-15 LAB — CBC WITH DIFFERENTIAL/PLATELET
BASOS ABS: 0.1 10*3/uL (ref 0.0–0.1)
Basophils Relative: 1 %
Eosinophils Absolute: 0.2 10*3/uL (ref 0.0–0.7)
Eosinophils Relative: 2 %
HEMATOCRIT: 39.4 % (ref 39.0–52.0)
Hemoglobin: 12.8 g/dL — ABNORMAL LOW (ref 13.0–17.0)
LYMPHS PCT: 17 %
Lymphs Abs: 1.5 10*3/uL (ref 0.7–4.0)
MCH: 29.4 pg (ref 26.0–34.0)
MCHC: 32.5 g/dL (ref 30.0–36.0)
MCV: 90.4 fL (ref 78.0–100.0)
MONO ABS: 0.6 10*3/uL (ref 0.1–1.0)
MONOS PCT: 7 %
NEUTROS ABS: 6.6 10*3/uL (ref 1.7–7.7)
Neutrophils Relative %: 73 %
Platelets: 183 10*3/uL (ref 150–400)
RBC: 4.36 MIL/uL (ref 4.22–5.81)
RDW: 13.9 % (ref 11.5–15.5)
WBC: 9 10*3/uL (ref 4.0–10.5)

## 2016-05-15 LAB — I-STAT TROPONIN, ED: Troponin i, poc: 0 ng/mL (ref 0.00–0.08)

## 2016-05-15 LAB — URINALYSIS, ROUTINE W REFLEX MICROSCOPIC
BILIRUBIN URINE: NEGATIVE
GLUCOSE, UA: NEGATIVE mg/dL
HGB URINE DIPSTICK: NEGATIVE
KETONES UR: NEGATIVE mg/dL
LEUKOCYTES UA: NEGATIVE
Nitrite: NEGATIVE
PROTEIN: NEGATIVE mg/dL
Specific Gravity, Urine: 1.024 (ref 1.005–1.030)
pH: 6 (ref 5.0–8.0)

## 2016-05-15 LAB — POC OCCULT BLOOD, ED: FECAL OCCULT BLD: NEGATIVE

## 2016-05-15 LAB — ETHANOL: Alcohol, Ethyl (B): <5 mg/dL (ref <5)

## 2016-05-15 LAB — LIPASE, BLOOD: LIPASE: 29 U/L (ref 11–51)

## 2016-05-15 MED ORDER — FAMOTIDINE IN NACL 20-0.9 MG/50ML-% IV SOLN
20.0000 mg | Freq: Once | INTRAVENOUS | Status: AC
  Administered 2016-05-15: 20 mg via INTRAVENOUS
  Filled 2016-05-15: qty 50

## 2016-05-15 MED ORDER — GI COCKTAIL ~~LOC~~
30.0000 mL | Freq: Once | ORAL | Status: AC
  Administered 2016-05-15: 30 mL via ORAL
  Filled 2016-05-15: qty 30

## 2016-05-15 MED ORDER — ONDANSETRON HCL 4 MG/2ML IJ SOLN
4.0000 mg | Freq: Once | INTRAMUSCULAR | Status: AC
  Administered 2016-05-15: 4 mg via INTRAVENOUS
  Filled 2016-05-15: qty 2

## 2016-05-15 MED ORDER — SODIUM CHLORIDE 0.9 % IV BOLUS (SEPSIS)
1000.0000 mL | Freq: Once | INTRAVENOUS | Status: AC
Start: 2016-05-15 — End: 2016-05-15
  Administered 2016-05-15: 1000 mL via INTRAVENOUS

## 2016-05-15 NOTE — Discharge Instructions (Signed)
Fill your prescriptions that were previously given to you. Follow-up with the gastroenterologist. Stop taking ibuprofen, naproxen or any nonsteroidal anti-inflammatory medication. Return immediately for worsening pain, persistent vomiting, blood in vomit or stool or for any concerns.

## 2016-05-15 NOTE — ED Notes (Signed)
MD at bedside. 

## 2016-05-15 NOTE — ED Provider Notes (Signed)
[facility] DEPT Provider Note   CSN: [id number] Arrival date & time: 05/15/16  [id number]  First Provider Contact:  First MD Initiated Contact with Patient 05/15/16 1556        History   Chief Complaint Chief Complaint  Patient presents with  . Abdominal Pain    HPI [name] is a 55 y.o. male.  HPI Patient presents with ongoing upper abdominal pain, multiple episodes of vomiting and constipation for the past week. Was seen 3 days ago with similar complaints. Was diagnosed with gastritis and prescribed PPI and Carafate. Patient states he is yet to fill these prescriptions. He has never seen a gastroenterologist. Alcohol consumption has exacerbated his symptoms in the past. Denies any recent alcohol consumption. Denies fever or chills. Denies chest pain or shortness of breath. Past Medical History:  Diagnosis Date  . Alcohol abuse   . Bipolar 1 disorder ([place])   . Hepatitis    tx 80's  . Hypertension   . Peripheral vascular disease ([place])    ?"had blood clots in arms- no tx?"  . Pneumonia    hx  . Seizures ([place])    last time past 5 months  . Stomach ulcer   . Stroke [facility])    weakness rt hand    Patient Active Problem List   Diagnosis Date Noted  . 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 Cerebral infarct ([place]) 10/29/2015  . Erectile dysfunction 10/28/2015  . Hypertension 10/28/2015  . Plantar fasciitis of right foot 10/28/2015  . Alcohol use disorder, severe, dependence ([place]) 10/25/2015  . 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 MDD (major depressive disorder), recurrent episode, severe ([place]) 10/25/2015  . Vitamin D deficiency 04/15/2015  . 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 Lipoma of flank 04/12/2015  . Seizures ([place]) 04/12/2015  . Persistent headaches 04/12/2015  . Horizontal nystagmus 04/12/2015  . Hypocalcemia 04/12/2015    Past Surgical History:  Procedure Laterality Date  . BRAIN TUMOR EXCISION   90   nasal approach  . HERNIA REPAIR Left   . SINUS ENDO WITH FUSION Left 04/17/2016   Procedure: LEFT MEDIAL MAXILLECTOMY VIA ENDOSCOPIC AND CALDWELL-LUC APPROACH WITH FUSION;  Surgeon: Melida Quitter, MD;  Location: [facility];  Service: ENT;  Laterality: Left;       Home Medications    Prior to Admission medications   Medication Sig Start Date End Date Taking? Authorizing Provider  cloNIDine (CATAPRES) 0.1 MG tablet Take 1 tablet (0.1 mg total) by mouth 2 (two) times daily. 10/28/15  Yes Josalyn Funches, MD  fluticasone (FLONASE) 50 MCG/ACT nasal spray Place 1 spray into both nostrils every morning.   Yes Historical Provider, MD  lisinopril-hydrochlorothiazide (PRINZIDE,ZESTORETIC) 20-12.5 MG tablet Take 1 tablet by mouth daily.   Yes Historical Provider, MD  phenytoin (DILANTIN) 100 MG ER capsule Take 100 mg by mouth 2 (two) times daily.   Yes Historical Provider, MD  sildenafil (REVATIO) 20 MG tablet Take 20 mg by mouth daily.   Yes Historical Provider, MD  aspirin 81 MG tablet Take 1 tablet (81 mg total) by mouth daily. Patient not taking: Reported on 04/15/2016 10/29/15   [name], MD  atorvastatin (LIPITOR) 40 MG tablet Take 1 tablet (40 mg total) by mouth daily. Patient not taking: Reported on 04/17/2016 10/29/15   [name], MD  cephALEXin (KEFLEX) 500 MG capsule Take 1 capsule (500 mg total) by mouth 3 (three) times daily. Patient not taking: Reported on 05/15/2016 04/18/16  Melida Quitter, MD  HYDROcodone-acetaminophen (NORCO/VICODIN) 5-325 MG tablet Take 2 tablets by mouth every 4 (four) hours as needed. 05/12/16   Tanna Furry, MD  omeprazole (PRILOSEC) 20 MG capsule Take 1 capsule (20 mg total) by mouth 2 (two) times daily. 05/12/16   Tanna Furry, MD  ondansetron (ZOFRAN ODT) 4 MG disintegrating tablet Take 1 tablet (4 mg total) by mouth every 8 (eight) hours as needed for nausea. 05/12/16   Tanna Furry, MD  sucralfate (CARAFATE) 1 g tablet Take 1 tablet (1 g total) by mouth 4 (four)  times daily. 05/12/16   Tanna Furry, MD    Family History Family History  Problem Relation Age of Onset  . Alcoholism Mother   . Alcoholism Father     Social History Social History  Substance Use Topics  . Smoking status: Current Some Day Smoker    Packs/day: 2.00    Years: 25.00    Types: Cigarettes  . Smokeless tobacco: Current User  . Alcohol use 7.2 oz/week    12 Cans of beer per week     Allergies   Borax   Review of Systems Review of Systems  Constitutional: Negative for chills and fever.  Respiratory: Negative for shortness of breath.   Cardiovascular: Negative for chest pain.  Gastrointestinal: Positive for abdominal pain, constipation, nausea and vomiting. Negative for abdominal distention, blood in stool and diarrhea.  Genitourinary: Negative for dysuria, flank pain and frequency.  Musculoskeletal: Negative for back pain, myalgias, neck pain and neck stiffness.  Skin: Negative for rash and wound.  Neurological: Negative for dizziness, syncope, weakness, light-headedness, numbness and headaches.  All other systems reviewed and are negative.    Physical Exam Updated Vital Signs BP 153/93   Pulse 61   Temp 99 F (37.2 C)   Resp 18   SpO2 99%   Physical Exam  Constitutional: He is oriented to person, place, and time. He appears well-developed and well-nourished.  HENT:  Head: Normocephalic and atraumatic.  Mouth/Throat: Oropharynx is clear and moist.  Eyes: EOM are normal. Pupils are equal, round, and reactive to light.  Neck: Normal range of motion. Neck supple. No JVD present.  Cardiovascular: Normal rate and regular rhythm.  Exam reveals no gallop and no friction rub.   No murmur heard. Pulmonary/Chest: Effort normal and breath sounds normal.  Abdominal: Soft. Bowel sounds are normal. There is tenderness. There is no rebound and no guarding.  Diffuse tenderness to palpation especially in the epigastric region. There is no rebound or guarding. No  pulsatile masses.  Musculoskeletal: Normal range of motion. He exhibits no edema or tenderness.  No lower extremity swelling or pain. No midline thoracic or lumbar tenderness. No CVA tenderness bilaterally. Distal pulses are equal and 2+.  Neurological: He is alert and oriented to person, place, and time.  Moving all extremities without deficit. Sensation is intact.  Skin: Skin is warm and dry. No rash noted. No erythema.  Psychiatric: He has a normal mood and affect. His behavior is normal.  Nursing note and vitals reviewed.    ED Treatments / Results  Labs (all labs ordered are listed, but only abnormal results are displayed) Labs Reviewed  CBC WITH DIFFERENTIAL/PLATELET - Abnormal; Notable for the following:       Result Value   Hemoglobin 12.8 (*)    All other components within normal limits  COMPREHENSIVE METABOLIC PANEL - Abnormal; Notable for the following:    Glucose, Bld 104 (*)    ALT 12 (*)  All other components within normal limits  URINALYSIS, ROUTINE W REFLEX MICROSCOPIC (NOT AT [facility]) - Abnormal; Notable for the following:    APPearance CLOUDY (*)    All other components within normal limits  LIPASE, BLOOD  ETHANOL  OCCULT BLOOD X 1 CARD TO LAB, STOOL  I-STAT TROPOININ, ED  POC OCCULT BLOOD, ED    EKG  EKG Interpretation  Date/Time:  Friday May 15 2016 16:35:58 EDT Ventricular Rate:  55 PR Interval:    QRS Duration: 87 QT Interval:  417 QTC Calculation: 399 R Axis:   38 Text Interpretation:  Sinus rhythm Confirmed by Jeneen Rinks  MD, [place] ([id number]) on 05/15/2016 4:43:16 PM       Radiology Dg Abd Acute W/chest  Result Date: 05/15/2016 CLINICAL DATA:  Abdominal pain. EXAM: DG ABDOMEN ACUTE W/ 1V CHEST COMPARISON:  05/12/2016 chest radiograph. 05/12/2016 unenhanced CT abdomen/pelvis. FINDINGS: Stable cardiomediastinal silhouette with top-normal heart size and aortic arch atherosclerosis. No pneumothorax. No pleural effusion. Lungs appear clear, with no acute  consolidative airspace disease and no pulmonary edema. No disproportionately dilated small bowel loops or air-fluid levels. Physiologic colonic stool volume. No evidence of pneumatosis, pneumoperitoneum or pathologic soft tissue calcifications. IMPRESSION: 1. No active disease in the chest. 2. Nonobstructive bowel gas pattern. 3. Aortic atherosclerosis. Electronically Signed   By: Ilona Sorrel M.D.   On: 05/15/2016 17:19    Procedures Procedures (including critical care time)  Medications Ordered in ED Medications  sodium chloride 0.9 % bolus 1,000 mL (0 mLs Intravenous Stopped 05/15/16 1916)  ondansetron (ZOFRAN) injection 4 mg (4 mg Intravenous Given 05/15/16 1717)  famotidine (PEPCID) IVPB 20 mg premix (0 mg Intravenous Stopped 05/15/16 1855)  gi cocktail (Maalox,Lidocaine,Donnatal) (30 mLs Oral Given 05/15/16 1859)     Initial Impression / Assessment and Plan / ED Course  I have reviewed the triage vital signs and the nursing notes.  Pertinent labs & imaging results that were available during my care of the patient were reviewed by me and considered in my medical decision making (see chart for details).  Clinical Course  Patient tolerating by mouth's. No vomiting in the emergency department. Do not believe that further imaging is necessary. He is advised to fill PPI prescription and follow-up with gastroenterology. Also advised to decrease alcohol consumption and to avoid all NSAIDs. He's been given return precautions and has voiced understanding.  Patient is resting comfortably in no distress.  Final Clinical Impressions(s) / ED Diagnoses   Final diagnoses:  Gastritis  Non-intractable vomiting with nausea, vomiting of unspecified type    New Prescriptions New Prescriptions   No medications on file     Julianne Rice, MD 05/15/16 1934

## 2016-05-15 NOTE — ED Notes (Signed)
Bed: [id number] Expected date:  Expected time:  Means of arrival:  Comments: 74F/fall/R hip pain

## 2016-05-15 NOTE — ED Triage Notes (Addendum)
Per EMS, pt c/o generalized abdominal pain x1 week. Dx with gastritis on 8/8. Pt reports vomiting but denies diarrhea.  Pt also reports he hasn't had a BM in one week. Tender to [facility]Q.

## 2016-05-15 NOTE — ED Notes (Signed)
Patient transported to X-ray 

## 2016-05-15 NOTE — ED Notes (Signed)
Bed: [facility] Expected date:  Expected time:  Means of arrival:  Comments: 52M/gen abd pain/hx of gastritis

## 2016-06-19 ENCOUNTER — Emergency Department (HOSPITAL_COMMUNITY)
Admission: EM | Admit: 2016-06-19 | Discharge: 2016-06-20 | Disposition: A | Discharge status: Home / Self Care | Payer: Medicaid Other | Attending: Dermatology | Admitting: Dermatology

## 2016-06-19 DIAGNOSIS — K6289 Other specified diseases of anus and rectum: Secondary | ICD-10-CM | POA: Diagnosis not present

## 2016-06-19 DIAGNOSIS — Z5321 Procedure and treatment not carried out due to patient leaving prior to being seen by health care provider: Secondary | ICD-10-CM | POA: Insufficient documentation

## 2016-06-19 DIAGNOSIS — F17210 Nicotine dependence, cigarettes, uncomplicated: Secondary | ICD-10-CM | POA: Insufficient documentation

## 2016-06-19 NOTE — ED Notes (Signed)
Bed: WTR6 Expected date:  Expected time:  Means of arrival:  Comments: 
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
## 2016-06-20 ENCOUNTER — Ambulatory Visit (HOSPITAL_COMMUNITY): Admission: RE | Admit: 2016-06-20 | Payer: Medicaid Other | Source: Ambulatory Visit

## 2016-06-20 ENCOUNTER — Encounter (HOSPITAL_COMMUNITY): Payer: Self-pay | Admitting: Emergency Medicine

## 2016-06-20 NOTE — ED Notes (Signed)
Pt states he does not want to wait to be seen by MD. Pt ambulatory with steady gait to waiting area. Attempts made to convince pt to wait but pain unwilling to wait.

## 2016-06-20 NOTE — ED Notes (Signed)
Pt ambulating to hallway and pacing around room
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
## 2016-10-03 ENCOUNTER — Encounter (HOSPITAL_COMMUNITY): Payer: Self-pay | Admitting: *Deleted

## 2016-10-03 ENCOUNTER — Emergency Department (HOSPITAL_COMMUNITY)
Admission: EM | Admit: 2016-10-03 | Discharge: 2016-10-03 | Disposition: A | Discharge status: Home / Self Care | Payer: Medicaid Other | Attending: Emergency Medicine | Admitting: Emergency Medicine

## 2016-10-03 ENCOUNTER — Emergency Department (HOSPITAL_COMMUNITY): Payer: Medicaid Other

## 2016-10-03 DIAGNOSIS — F17210 Nicotine dependence, cigarettes, uncomplicated: Secondary | ICD-10-CM | POA: Insufficient documentation

## 2016-10-03 DIAGNOSIS — Z7982 Long term (current) use of aspirin: Secondary | ICD-10-CM | POA: Diagnosis not present

## 2016-10-03 DIAGNOSIS — I10 Essential (primary) hypertension: Secondary | ICD-10-CM | POA: Diagnosis not present

## 2016-10-03 DIAGNOSIS — J069 Acute upper respiratory infection, unspecified: Secondary | ICD-10-CM | POA: Diagnosis not present

## 2016-10-03 DIAGNOSIS — Z8673 Personal history of transient ischemic attack (TIA), and cerebral infarction without residual deficits: Secondary | ICD-10-CM | POA: Diagnosis not present

## 2016-10-03 DIAGNOSIS — R52 Pain, unspecified: Secondary | ICD-10-CM | POA: Diagnosis present

## 2016-10-03 LAB — CBC
HCT: 37.1 % — ABNORMAL LOW (ref 39.0–52.0)
Hemoglobin: 12.6 g/dL — ABNORMAL LOW (ref 13.0–17.0)
MCH: 29.2 pg (ref 26.0–34.0)
MCHC: 34 g/dL (ref 30.0–36.0)
MCV: 85.9 fL (ref 78.0–100.0)
PLATELETS: 127 10*3/uL — AB (ref 150–400)
RBC: 4.32 MIL/uL (ref 4.22–5.81)
RDW: 14.2 % (ref 11.5–15.5)
WBC: 5.4 10*3/uL (ref 4.0–10.5)

## 2016-10-03 LAB — COMPREHENSIVE METABOLIC PANEL
ALT: 23 U/L (ref 17–63)
AST: 26 U/L (ref 15–41)
Albumin: 3.7 g/dL (ref 3.5–5.0)
Alkaline Phosphatase: 62 U/L (ref 38–126)
Anion gap: 7 (ref 5–15)
BILIRUBIN TOTAL: 0.6 mg/dL (ref 0.3–1.2)
BUN: 13 mg/dL (ref 6–20)
CO2: 23 mmol/L (ref 22–32)
CREATININE: 0.81 mg/dL (ref 0.61–1.24)
Calcium: 8.3 mg/dL — ABNORMAL LOW (ref 8.9–10.3)
Chloride: 103 mmol/L (ref 101–111)
GFR calc Af Amer: >60 mL/min (ref >60)
Glucose, Bld: 100 mg/dL — ABNORMAL HIGH (ref 65–99)
Potassium: 3.7 mmol/L (ref 3.5–5.1)
Sodium: 133 mmol/L — ABNORMAL LOW (ref 135–145)
TOTAL PROTEIN: 6.5 g/dL (ref 6.5–8.1)

## 2016-10-03 LAB — I-STAT TROPONIN, ED: Troponin i, poc: 0.01 ng/mL (ref 0.00–0.08)

## 2016-10-03 MED ORDER — OXYCODONE-ACETAMINOPHEN 5-325 MG PO TABS
1.0000 | ORAL_TABLET | Freq: Once | ORAL | Status: DC
  Filled 2016-10-03: qty 1

## 2016-10-03 MED ORDER — PREDNISONE 10 MG PO TABS: 20.0000 mg | ORAL_TABLET | Freq: Every day | ORAL | 0 refills | Status: AC

## 2016-10-03 MED ORDER — BENZONATATE 100 MG PO CAPS: 100.0000 mg | ORAL_CAPSULE | Freq: Three times a day (TID) | ORAL | 0 refills | Status: DC | PRN

## 2016-10-03 MED ORDER — METHYLPREDNISOLONE 4 MG PO TBPK: ORAL_TABLET | ORAL | 0 refills | Status: DC

## 2016-10-03 MED ORDER — ALBUTEROL SULFATE HFA 108 (90 BASE) MCG/ACT IN AERS
2.0000 | INHALATION_SPRAY | RESPIRATORY_TRACT | Status: DC
  Administered 2016-10-03: 2 via RESPIRATORY_TRACT
  Filled 2016-10-03: qty 6.7

## 2016-10-03 NOTE — ED Triage Notes (Signed)
Patient is alert and oriented x4.  He is complaining of generalized body aches and tremors with a fever.  Additionally  today the patient states that he was having nausea, vomiting and a productive cough.  Currently he rates his pain 10 of 10.

## 2016-10-03 NOTE — Discharge Instructions (Signed)
Please follow-up with your primary care physician in 2-5 days. Please use albuterol inhaler 2 puffs every 4 hours as needed for cough.   Get help right away if: You have severe or persistent: Headache. Ear pain. Sinus pain. Chest pain. You have chronic lung disease and any of the following: Wheezing. Prolonged cough. Coughing up blood. A change in your usual mucus. You have a stiff neck. You have changes in your: Vision. Hearing. Thinking. Mood.

## 2016-10-03 NOTE — ED Provider Notes (Signed)
[facility] DEPT Provider Note   CSN: [id number] Arrival date & time: 10/03/16  0250     History   Chief Complaint Chief Complaint  Patient presents with  . Influenza    HPI [name] is a 55 y.o. male presents with generalized body aches and cough 2 days. Patient starts that his symptoms have progressively worsened since then. Patient states that his cough has brown sputum, but denies blood. Patient also reports sharp central chest pain, nonradiating, associated with cough. Patient reports associated nausea and one episode of vomiting after trying to take food. He denies blood in emesis. Patient states his general body aches is 10 out of 10 at this moment. Patient also reports a progressive headache that wraps around his head, decreased appetite and intake. Patient reports not taking anything for symptoms. Patient denies any urinary symptoms, changes in bowel movements, visual symptoms, neck pain, neck stiffness, changes in gait.  HPI  Past Medical History:  Diagnosis Date  . Alcohol abuse   . Bipolar 1 disorder ([place])   . Hepatitis    tx 80's  . Hypertension   . Peripheral vascular disease ([place])    ?"had blood clots in arms- no tx?"  . Pneumonia    hx  . Seizures ([place])    last time past 5 months  . Stomach ulcer   . Stroke [facility])    weakness rt hand    Patient Active Problem List   Diagnosis Date Noted  . 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 Cerebral infarct ([place]) 10/29/2015  . Erectile dysfunction 10/28/2015  . Hypertension 10/28/2015  . Plantar fasciitis of right foot 10/28/2015  . Alcohol use disorder, severe, dependence ([place]) 10/25/2015  . 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 MDD (major depressive disorder), recurrent episode, severe ([place]) 10/25/2015  . Vitamin D deficiency 04/15/2015  . 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 Lipoma of flank 04/12/2015  . Seizures ([place]) 04/12/2015  . Persistent  headaches 04/12/2015  . Horizontal nystagmus 04/12/2015  . Hypocalcemia 04/12/2015    Past Surgical History:  Procedure Laterality Date  . BRAIN TUMOR EXCISION  90   nasal approach  . HERNIA REPAIR Left   . SINUS ENDO WITH FUSION Left 04/17/2016   Procedure: LEFT MEDIAL MAXILLECTOMY VIA ENDOSCOPIC AND CALDWELL-LUC APPROACH WITH FUSION;  Surgeon: Melida Quitter, MD;  Location: [facility];  Service: ENT;  Laterality: Left;       Home Medications    Prior to Admission medications   Medication Sig Start Date End Date Taking? Authorizing Provider  aspirin 81 MG tablet Take 1 tablet (81 mg total) by mouth daily. Patient not taking: Reported on 04/15/2016 10/29/15   [name], MD  atorvastatin (LIPITOR) 40 MG tablet Take 1 tablet (40 mg total) by mouth daily. Patient not taking: Reported on 04/17/2016 10/29/15   [name], MD  benzonatate (TESSALON) 100 MG capsule Take 1 capsule (100 mg total) by mouth 3 (three) times daily as needed for cough. 10/03/16   Riann Oman Manuel [place], [place]  cephALEXin (KEFLEX) 500 MG capsule Take 1 capsule (500 mg total) by mouth 3 (three) times daily. Patient not taking: Reported on 05/15/2016 04/18/16   Melida Quitter, MD  cloNIDine (CATAPRES) 0.1 MG tablet Take 1 tablet (0.1 mg total) by mouth 2 (two) times daily. 10/28/15   Josalyn Funches, MD  fluticasone (FLONASE) 50 MCG/ACT nasal spray Place 1 spray into both nostrils every morning.    Historical Provider, MD  HYDROcodone-acetaminophen (NORCO/VICODIN) 5-325 MG tablet Take 2 tablets by mouth every 4 (four) hours as needed. 05/12/16   Tanna Furry, MD  lisinopril-hydrochlorothiazide (PRINZIDE,ZESTORETIC) 20-12.5 MG tablet Take 1 tablet by mouth daily.    Historical Provider, MD  omeprazole (PRILOSEC) 20 MG capsule Take 1 capsule (20 mg total) by mouth 2 (two) times daily. 05/12/16   Tanna Furry, MD  ondansetron (ZOFRAN ODT) 4 MG disintegrating tablet Take 1 tablet (4 mg total) by mouth every 8 (eight) hours as needed for  nausea. 05/12/16   Tanna Furry, MD  phenytoin (DILANTIN) 100 MG ER capsule Take 100 mg by mouth 2 (two) times daily.    Historical Provider, MD  predniSONE (DELTASONE) 10 MG tablet Take 2 tablets (20 mg total) by mouth daily. 10/03/16 10/08/16  Amad Mau Manuel [place]aslyn [place], [place]  sildenafil (REVATIO) 20 MG tablet Take 20 mg by mouth daily.    Historical Provider, MD  sucralfate (CARAFATE) 1 g tablet Take 1 tablet (1 g total) by mouth 4 (four) times daily. 05/12/16   Tanna Furry, MD    Family History Family History  Problem Relation Age of Onset  . Alcoholism Mother   . Alcoholism Father     Social History Social History  Substance Use Topics  . Smoking status: Current Some Day Smoker    Packs/day: 2.00    Years: 25.00    Types: Cigarettes  . Smokeless tobacco: Current User  . Alcohol use 7.2 oz/week    12 Cans of beer per week     Allergies   Borax   Review of Systems Review of Systems  Constitutional: Positive for appetite change and chills. Negative for fever.  HENT: Positive for congestion.   Eyes: Negative for visual disturbance.  Respiratory: Positive for cough and shortness of breath.   Cardiovascular: Positive for chest pain.  Gastrointestinal: Positive for nausea. Negative for constipation and diarrhea.  Genitourinary: Negative for difficulty urinating and dysuria.  Musculoskeletal: Positive for myalgias. Negative for neck pain and neck stiffness.  Skin: Negative for wound.  Neurological: Positive for headaches. Negative for speech difficulty and numbness.     Physical Exam Updated Vital Signs BP 145/59 (BP Location: Left Arm)   Pulse 78   Temp 100.2 F (37.9 C) (Oral)   Resp 18   Ht 5\' 7"  (1.702 m)   Wt 68 kg   SpO2 96%   BMI 23.49 kg/m   Physical Exam  Constitutional: He is oriented to person, place, and time. He appears well-developed and well-nourished.  HENT:  Head: Normocephalic and atraumatic.  Nose: Nose normal.  Mouth/Throat: Oropharynx is clear and  moist.  Eyes: Conjunctivae and EOM are normal. Pupils are equal, round, and reactive to light.  Neck: Normal range of motion. Neck supple.  Cardiovascular: Normal rate and normal heart sounds.   Pulmonary/Chest: Effort normal and breath sounds normal. No respiratory distress. He exhibits no tenderness.  Abdominal: Soft. Bowel sounds are normal. There is no tenderness. There is no rebound and no guarding.  Musculoskeletal: Normal range of motion.  Neurological: He is alert and oriented to person, place, and time. No sensory deficit. Coordination normal.  Cranial Nerves:  III,IV, VI: ptosis not present, extra-ocular movements intact bilaterally, direct and consensual pupillary light reflexes intact bilaterally V: facial sensation, jaw opening, and bite strength equal bilaterally VII: eyebrow raise, eyelid close, smile, frown, pucker equal bilaterally VIII: hearing grossly normal bilaterally  IX,X: palate elevation and swallowing intact XI: bilateral shoulder shrug and lateral head rotation equal  and strong XII: midline tongue extension  Negative pronator drift, negative finger to nose, negative heel-to-shin, negative RAMs.  Patient able to ambulate without difficulty.  Skin: Skin is warm. Capillary refill takes less than 2 seconds.  Psychiatric: He has a normal mood and affect. His behavior is normal.  Nursing note and vitals reviewed.    ED Treatments / Results  Labs (all labs ordered are listed, but only abnormal results are displayed) Labs Reviewed  COMPREHENSIVE METABOLIC PANEL - Abnormal; Notable for the following:       Result Value   Sodium 133 (*)    Glucose, Bld 100 (*)    Calcium 8.3 (*)    All other components within normal limits  CBC - Abnormal; Notable for the following:    Hemoglobin 12.6 (*)    HCT 37.1 (*)    Platelets 127 (*)    All other components within normal limits  I-STAT TROPOININ, ED    EKG  EKG Interpretation  Date/Time:  Saturday October 03 2016 07:53:12 EST Ventricular Rate:  77 PR Interval:    QRS Duration: 78 QT Interval:  372 QTC Calculation: 421 R Axis:   22 Text Interpretation:  Sinus rhythm since last tracing no significant change Confirmed by Eulis Foster  MD, ELLIOTT [phone]) on 10/03/2016 9:46:53 AM       Radiology Dg Chest 2 View  Result Date: 10/03/2016 CLINICAL DATA:  Fever, body aches, weakness, cough and congestion for 2 days EXAM: CHEST  2 VIEW COMPARISON:  05/15/2016 FINDINGS: Heart is upper limits normal in size. Lungs are clear. No effusions or acute bony abnormality. IMPRESSION: No active cardiopulmonary disease. Electronically Signed   By: Rolm Baptise M.D.   On: 10/03/2016 07:39    Procedures Procedures (including critical care time)  Medications Ordered in ED Medications  albuterol (PROVENTIL HFA;VENTOLIN HFA) 108 (90 Base) MCG/ACT inhaler 2 puff (2 puffs Inhalation Given 10/03/16 1025)     Initial Impression / Assessment and Plan / ED Course  I have reviewed the triage vital signs and the nursing notes.  Pertinent labs & imaging results that were available during my care of the patient were reviewed by me and considered in my medical decision making (see chart for details).  Clinical Course   Pt is a 55yo male presents with presenting with congestion, cough and body aches 2 days. On exam, pt in NAD. VSS. No hypoxia. Temperature at 100.7. Lungs clear, Heart sounds clear. Normal work of breathing. TMs clear. Throat benign. Abdomen benign. Pt CXR negative for acute infiltrate. Lab work unremarkable and within normal limites with no electrolyte imbalance. Troponin negative. EKG with no acute findings. Patients symptoms are consistent with URI, likely viral etiology. Discussed that antibiotics are not indicated for viral infections. Pt will be discharged with symptomatic treatment.   Verbalizes understanding and is agreeable with plan.  Patient feeling betterhemodynamically stable & in NAD prior to dc.     Final Clinical Impressions(s) / ED Diagnoses   Final diagnoses:  Upper respiratory tract infection, unspecified type    New Prescriptions Discharge Medication List as of 10/03/2016 10:04 AM    START taking these medications   Details  predniSONE (DELTASONE) 10 MG tablet Take 2 tablets (20 mg total) by mouth daily., Starting Sat 10/03/2016, Until Thu 10/08/2016, [place], [place] 10/03/16 [place], MD 10/05/16 [phone]

## 2016-10-03 NOTE — ED Notes (Signed)
Urine collect and labeled for provider if needed  influenza swab preformed and tubed waiting for provider use  Door labeled for possible droplet precautions

## 2016-10-03 NOTE — ED Notes (Signed)
He is sleeping so soundly I defer the giving of his pain medicine. He falls asleep while I performed the EKG. He remains in no distress.

## 2016-10-18 ENCOUNTER — Encounter (HOSPITAL_COMMUNITY): Payer: Self-pay | Admitting: *Deleted

## 2016-10-18 ENCOUNTER — Emergency Department (HOSPITAL_COMMUNITY)
Admission: EM | Admit: 2016-10-18 | Discharge: 2016-10-19 | Discharge status: Against Medical Advice | Payer: Medicaid Other | Attending: Emergency Medicine | Admitting: Emergency Medicine

## 2016-10-18 DIAGNOSIS — Z79899 Other long term (current) drug therapy: Secondary | ICD-10-CM | POA: Diagnosis not present

## 2016-10-18 DIAGNOSIS — F10920 Alcohol use, unspecified with intoxication, uncomplicated: Secondary | ICD-10-CM

## 2016-10-18 DIAGNOSIS — F17210 Nicotine dependence, cigarettes, uncomplicated: Secondary | ICD-10-CM | POA: Diagnosis not present

## 2016-10-18 DIAGNOSIS — Z8673 Personal history of transient ischemic attack (TIA), and cerebral infarction without residual deficits: Secondary | ICD-10-CM | POA: Diagnosis not present

## 2016-10-18 DIAGNOSIS — I10 Essential (primary) hypertension: Secondary | ICD-10-CM | POA: Diagnosis not present

## 2016-10-18 DIAGNOSIS — F10120 Alcohol abuse with intoxication, uncomplicated: Secondary | ICD-10-CM | POA: Insufficient documentation

## 2016-10-18 DIAGNOSIS — Z7982 Long term (current) use of aspirin: Secondary | ICD-10-CM | POA: Insufficient documentation

## 2016-10-18 LAB — CBG MONITORING, ED: Glucose-Capillary: 108 mg/dL — ABNORMAL HIGH (ref 65–99)
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
## 2016-10-19 LAB — URINALYSIS, ROUTINE W REFLEX MICROSCOPIC
BILIRUBIN URINE: NEGATIVE
Glucose, UA: NEGATIVE mg/dL
HGB URINE DIPSTICK: NEGATIVE
KETONES UR: NEGATIVE mg/dL
Leukocytes, UA: NEGATIVE
NITRITE: NEGATIVE
PH: 6 (ref 5.0–8.0)
Protein, ur: NEGATIVE mg/dL
SPECIFIC GRAVITY, URINE: 1.004 — AB (ref 1.005–1.030)

## 2016-10-19 LAB — CBC WITH DIFFERENTIAL/PLATELET
BASOS ABS: 0 10*3/uL (ref 0.0–0.1)
Basophils Relative: 1 %
EOS ABS: 0.2 10*3/uL (ref 0.0–0.7)
Eosinophils Relative: 3 %
HEMATOCRIT: 37.7 % — AB (ref 39.0–52.0)
HEMOGLOBIN: 12.4 g/dL — AB (ref 13.0–17.0)
Lymphocytes Relative: 33 %
Lymphs Abs: 2 10*3/uL (ref 0.7–4.0)
MCH: 28.6 pg (ref 26.0–34.0)
MCHC: 32.9 g/dL (ref 30.0–36.0)
MCV: 87.1 fL (ref 78.0–100.0)
Monocytes Absolute: 0.4 10*3/uL (ref 0.1–1.0)
Monocytes Relative: 6 %
NEUTROS ABS: 3.5 10*3/uL (ref 1.7–7.7)
NEUTROS PCT: 57 %
Platelets: 210 10*3/uL (ref 150–400)
RBC: 4.33 MIL/uL (ref 4.22–5.81)
RDW: 15.3 % (ref 11.5–15.5)
WBC: 6.1 10*3/uL (ref 4.0–10.5)

## 2016-10-19 LAB — COMPREHENSIVE METABOLIC PANEL
ALK PHOS: 61 U/L (ref 38–126)
ALT: 47 U/L (ref 17–63)
ANION GAP: 9 (ref 5–15)
AST: 37 U/L (ref 15–41)
Albumin: 3.6 g/dL (ref 3.5–5.0)
BUN: 5 mg/dL — ABNORMAL LOW (ref 6–20)
CALCIUM: 7.9 mg/dL — AB (ref 8.9–10.3)
CO2: 22 mmol/L (ref 22–32)
CREATININE: 0.63 mg/dL (ref 0.61–1.24)
Chloride: 113 mmol/L — ABNORMAL HIGH (ref 101–111)
Glucose, Bld: 97 mg/dL (ref 65–99)
Potassium: 3.6 mmol/L (ref 3.5–5.1)
SODIUM: 144 mmol/L (ref 135–145)
TOTAL PROTEIN: 6.4 g/dL — AB (ref 6.5–8.1)
Total Bilirubin: 0.5 mg/dL (ref 0.3–1.2)

## 2016-10-19 LAB — RAPID URINE DRUG SCREEN, HOSP PERFORMED
AMPHETAMINES: NOT DETECTED
BENZODIAZEPINES: NOT DETECTED
Barbiturates: NOT DETECTED
Cocaine: NOT DETECTED
Opiates: NOT DETECTED
TETRAHYDROCANNABINOL: NOT DETECTED

## 2016-10-19 LAB — ETHANOL: Alcohol, Ethyl (B): 190 mg/dL — ABNORMAL HIGH (ref <5)

## 2016-10-19 MED ORDER — SODIUM CHLORIDE 0.9 % IV BOLUS (SEPSIS)
1000.0000 mL | Freq: Once | INTRAVENOUS | Status: AC
  Administered 2016-10-19: 1000 mL via INTRAVENOUS

## 2016-10-19 NOTE — ED Notes (Signed)
When provider went into room patient was not in there and all belongings were gone. Patient was just observed within the previous 10 min and was present in room with no complaints. Pt did not make staff aware that he was leaving the facility. All cuffs and monitor was disconnected and laying in room. Camera operator Kallie Locks), Security, and [employer] was notified of patient having left room and possible the premises. Right arm IV was not removed by staff and no signs of IV in room. Staff searched bathrooms and hospital rooms and patient was not found. Security staff searched hospital parameters and patient not found.

## 2016-10-19 NOTE — ED Notes (Signed)
Patient denies pain and is resting comfortably.  
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
## 2017-01-07 ENCOUNTER — Ambulatory Visit: Payer: Medicaid Other | Admitting: Podiatry

## 2017-02-03 ENCOUNTER — Emergency Department (HOSPITAL_COMMUNITY): Payer: Medicaid Other

## 2017-02-03 ENCOUNTER — Encounter (HOSPITAL_COMMUNITY): Payer: Self-pay

## 2017-02-03 ENCOUNTER — Emergency Department (HOSPITAL_COMMUNITY)
Admission: EM | Admit: 2017-02-03 | Discharge: 2017-02-03 | Disposition: A | Discharge status: Home / Self Care | Payer: Medicaid Other | Attending: Emergency Medicine | Admitting: Emergency Medicine

## 2017-02-03 DIAGNOSIS — Z8673 Personal history of transient ischemic attack (TIA), and cerebral infarction without residual deficits: Secondary | ICD-10-CM | POA: Insufficient documentation

## 2017-02-03 DIAGNOSIS — W108XXA Fall (on) (from) other stairs and steps, initial encounter: Secondary | ICD-10-CM | POA: Insufficient documentation

## 2017-02-03 DIAGNOSIS — F17210 Nicotine dependence, cigarettes, uncomplicated: Secondary | ICD-10-CM | POA: Insufficient documentation

## 2017-02-03 DIAGNOSIS — Y999 Unspecified external cause status: Secondary | ICD-10-CM | POA: Insufficient documentation

## 2017-02-03 DIAGNOSIS — S99921A Unspecified injury of right foot, initial encounter: Secondary | ICD-10-CM | POA: Diagnosis present

## 2017-02-03 DIAGNOSIS — Y929 Unspecified place or not applicable: Secondary | ICD-10-CM | POA: Diagnosis not present

## 2017-02-03 DIAGNOSIS — S0990XA Unspecified injury of head, initial encounter: Secondary | ICD-10-CM | POA: Diagnosis not present

## 2017-02-03 DIAGNOSIS — S92354A Nondisplaced fracture of fifth metatarsal bone, right foot, initial encounter for closed fracture: Secondary | ICD-10-CM | POA: Insufficient documentation

## 2017-02-03 DIAGNOSIS — S0003XA Contusion of scalp, initial encounter: Secondary | ICD-10-CM | POA: Insufficient documentation

## 2017-02-03 DIAGNOSIS — Z79899 Other long term (current) drug therapy: Secondary | ICD-10-CM | POA: Diagnosis not present

## 2017-02-03 DIAGNOSIS — Y939 Activity, unspecified: Secondary | ICD-10-CM | POA: Insufficient documentation

## 2017-02-03 DIAGNOSIS — I10 Essential (primary) hypertension: Secondary | ICD-10-CM | POA: Diagnosis not present

## 2017-02-03 LAB — CBC WITH DIFFERENTIAL/PLATELET
BASOS ABS: 0.1 10*3/uL (ref 0.0–0.1)
Basophils Relative: 1 %
EOS ABS: 0.3 10*3/uL (ref 0.0–0.7)
EOS PCT: 3 %
HCT: 40.6 % (ref 39.0–52.0)
Hemoglobin: 13.4 g/dL (ref 13.0–17.0)
LYMPHS PCT: 24 %
Lymphs Abs: 2.1 10*3/uL (ref 0.7–4.0)
MCH: 29.6 pg (ref 26.0–34.0)
MCHC: 33 g/dL (ref 30.0–36.0)
MCV: 89.6 fL (ref 78.0–100.0)
Monocytes Absolute: 0.7 10*3/uL (ref 0.1–1.0)
Monocytes Relative: 7 %
Neutro Abs: 5.8 10*3/uL (ref 1.7–7.7)
Neutrophils Relative %: 65 %
PLATELETS: 194 10*3/uL (ref 150–400)
RBC: 4.53 MIL/uL (ref 4.22–5.81)
RDW: 13.8 % (ref 11.5–15.5)
WBC: 9 10*3/uL (ref 4.0–10.5)

## 2017-02-03 LAB — COMPREHENSIVE METABOLIC PANEL
ALBUMIN: 3.5 g/dL (ref 3.5–5.0)
ALT: 16 U/L — AB (ref 17–63)
AST: 24 U/L (ref 15–41)
Alkaline Phosphatase: 69 U/L (ref 38–126)
Anion gap: 10 (ref 5–15)
BUN: 15 mg/dL (ref 6–20)
CHLORIDE: 104 mmol/L (ref 101–111)
CO2: 22 mmol/L (ref 22–32)
Calcium: 8.7 mg/dL — ABNORMAL LOW (ref 8.9–10.3)
Creatinine, Ser: 0.84 mg/dL (ref 0.61–1.24)
Glucose, Bld: 102 mg/dL — ABNORMAL HIGH (ref 65–99)
POTASSIUM: 3.8 mmol/L (ref 3.5–5.1)
SODIUM: 136 mmol/L (ref 135–145)
Total Bilirubin: 0.3 mg/dL (ref 0.3–1.2)
Total Protein: 6.9 g/dL (ref 6.5–8.1)

## 2017-02-03 LAB — URINALYSIS, ROUTINE W REFLEX MICROSCOPIC
Bilirubin Urine: NEGATIVE
Glucose, UA: NEGATIVE mg/dL
Hgb urine dipstick: NEGATIVE
Ketones, ur: NEGATIVE mg/dL
Leukocytes, UA: NEGATIVE
NITRITE: NEGATIVE
Protein, ur: NEGATIVE mg/dL
Specific Gravity, Urine: 1.019 (ref 1.005–1.030)
pH: 5 (ref 5.0–8.0)

## 2017-02-03 LAB — RAPID URINE DRUG SCREEN, HOSP PERFORMED
AMPHETAMINES: NOT DETECTED
BENZODIAZEPINES: NOT DETECTED
Barbiturates: NOT DETECTED
COCAINE: NOT DETECTED
OPIATES: NOT DETECTED
TETRAHYDROCANNABINOL: NOT DETECTED

## 2017-02-03 LAB — ETHANOL: Alcohol, Ethyl (B): <5 mg/dL (ref <5)

## 2017-02-03 MED ORDER — HYDROCODONE-ACETAMINOPHEN 5-325 MG PO TABS: 1.0000 | ORAL_TABLET | ORAL | 0 refills | Status: DC | PRN

## 2017-02-03 NOTE — ED Notes (Signed)
Pt up to bedside to use urinal with RN assistance; pt placed back to bed; pt given phone to call family

## 2017-02-03 NOTE — ED Notes (Signed)
Pt called out stating that he was ready to check out. Notified pt that his RN will be in soon with discharge papers.

## 2017-02-03 NOTE — ED Notes (Signed)
Returned from CT.

## 2017-02-03 NOTE — ED Notes (Signed)
Patient transported to CT 

## 2017-02-03 NOTE — ED Triage Notes (Signed)
Pt brought in by [facility]. Pt had a fall down 13 steps today. Pt had positive LOC when he fell. Pt is complaining of right foot pain and head and neck pain. Pt hx seizures. States he blacked out twice for the past two day and does not recall blacking out now. Fall occurred between 3-4pm. Pt pupils pinpoint on arrival and patient becoming increasingly lethargic.

## 2017-02-03 NOTE — ED Provider Notes (Signed)
[name]   CSN: [id number] Arrival date & time: 02/03/17  0115  By signing my name below, I, [name], attest that this documentation has been prepared under the direction and in the presence of [name], MD. Electronically Signed: [name], ED Scribe. 02/03/17. 2:12 AM.   History   Chief Complaint Chief Complaint  Patient presents with  . Fall    HPI [name] is a 56 y.o. male with a PMHx of seizures, who presents to the Emergency Department complaining of right foot pain s/p falling down 13 steps ~ 3-4 pm on 02/02/17. Pt notes LOC. Additionally he c/o of head and neck pain. Was able to get up with help. Now he reports all over weakness. He has become increasingly lethargic. Pt denies SOB, dizziness, nausea and emesis.  The history is provided by the patient and the EMS personnel. No language interpreter was used.    Past Medical History:  Diagnosis Date  . Alcohol abuse   . Bipolar 1 disorder ([place])   . Hepatitis    tx 80's  . Hypertension   . Peripheral vascular disease ([place])    ?"had blood clots in arms- no tx?"  . Pneumonia    hx  . Seizures ([place])    last time past 5 months  . Stomach ulcer   . Stroke [facility])    weakness rt hand    Patient Active Problem List   Diagnosis Date Noted  . 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 Cerebral infarct ([place]) 10/29/2015  . Erectile dysfunction 10/28/2015  . Hypertension 10/28/2015  . Plantar fasciitis of right foot 10/28/2015  . Alcohol use disorder, severe, dependence ([place]) 10/25/2015  . 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 MDD (major depressive disorder), recurrent episode, severe ([place]) 10/25/2015  . Vitamin D deficiency 04/15/2015  . 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 Lipoma of flank 04/12/2015  . Seizures ([place]) 04/12/2015  . Persistent headaches 04/12/2015  . Horizontal nystagmus 04/12/2015  . Hypocalcemia  04/12/2015    Past Surgical History:  Procedure Laterality Date  . BRAIN TUMOR EXCISION  90   nasal approach  . HERNIA REPAIR Left   . SINUS ENDO WITH FUSION Left 04/17/2016   Procedure: LEFT MEDIAL MAXILLECTOMY VIA ENDOSCOPIC AND CALDWELL-LUC APPROACH WITH FUSION;  Surgeon: Melida Quitter, MD;  Location: [facility];  Service: ENT;  Laterality: Left;       Home Medications    Prior to Admission medications   Medication Sig Start Date End Date Taking? Authorizing Provider  benzonatate (TESSALON) 100 MG capsule Take 1 capsule (100 mg total) by mouth 3 (three) times daily as needed for cough. 10/03/16   [name], [place]  cloNIDine (CATAPRES) 0.1 MG tablet Take 1 tablet (0.1 mg total) by mouth 2 (two) times daily. 10/28/15   Christopher Funches, MD  fluticasone (FLONASE) 50 MCG/ACT nasal spray Place 1 spray into both nostrils every morning.    Historical Provider, MD  HYDROcodone-acetaminophen (NORCO/VICODIN) 5-325 MG tablet Take 1 tablet by mouth every 4 (four) hours as needed for moderate pain. 02/03/17   [name], MD  lisinopril-hydrochlorothiazide (PRINZIDE,ZESTORETIC) 20-12.5 MG tablet Take 1 tablet by mouth daily.    Historical Provider, MD  omeprazole (PRILOSEC) 20 MG capsule Take 1 capsule (20 mg total) by mouth 2 (two) times daily. 05/12/16   Tanna Furry, MD  phenytoin (DILANTIN) 100 MG ER capsule Take 100 mg by mouth 2 (two)  times daily.    Historical Provider, MD  sildenafil (REVATIO) 20 MG tablet Take 20 mg by mouth daily.    Historical Provider, MD  sucralfate (CARAFATE) 1 g tablet Take 1 tablet (1 g total) by mouth 4 (four) times daily. 05/12/16   Tanna Furry, MD    Family History Family History  Problem Relation Age of Onset  . Alcoholism Mother   . Alcoholism Father     Social History Social History  Substance Use Topics  . Smoking status: Current Some Day Smoker    Packs/day: 2.00    Years: 25.00    Types: Cigarettes  . Smokeless tobacco: Current User  .  Alcohol use 7.2 oz/week    12 Cans of beer per week     Allergies   Borax   Review of Systems Review of Systems  Respiratory: Negative for shortness of breath.   Gastrointestinal: Negative for nausea and vomiting.  Musculoskeletal: Positive for neck pain.  Neurological: Positive for headaches. Negative for dizziness.  All other systems reviewed and are negative.    Physical Exam Updated Vital Signs BP 105/61   Pulse 74   Temp 98.3 F (36.8 C) (Oral)   Resp (!) 22   Ht 5\' 6"  (1.676 m)   Wt 160 lb (72.6 kg)   SpO2 96%   BMI 25.82 kg/m   Physical Exam  Constitutional: He is oriented to person, place, and time. He appears well-developed and well-nourished. No distress.  HENT:  Head: Normocephalic and atraumatic.  Right Ear: Hearing normal.  Left Ear: Hearing normal.  Nose: Nose normal.  Mouth/Throat: Oropharynx is clear and moist and mucous membranes are normal.  Contusion to right occipital scalp.   Eyes: Conjunctivae and EOM are normal. Pupils are equal, round, and reactive to light.  Neck: Normal range of motion. Neck supple.  Cardiovascular: Regular rhythm, S1 normal and S2 normal.  Exam reveals no gallop and no friction rub.   No murmur heard. Pulmonary/Chest: Effort normal and breath sounds normal. No respiratory distress. He exhibits no tenderness.  Abdominal: Soft. Normal appearance and bowel sounds are normal. There is no hepatosplenomegaly. There is no tenderness. There is no rebound, no guarding, no tenderness at McBurney's point and negative Murphy's sign. No hernia.  Musculoskeletal: Normal range of motion.  No deformity, no edema of right heel, toes and foot.  Tenderness of right heel, right toes, right foot.  Neurological: He is alert and oriented to person, place, and time. He has normal strength. No cranial nerve deficit or sensory deficit. Coordination normal. GCS eye subscore is 4. GCS verbal subscore is 5. GCS motor subscore is 6.  Skin: Skin is  warm, dry and intact. No rash noted. No cyanosis.  Psychiatric: He has a normal mood and affect. His speech is normal and behavior is normal. Thought content normal.  Nursing note and vitals reviewed.    ED Treatments / Results  DIAGNOSTIC STUDIES: Oxygen Saturation is 100% on RA, normal by my interpretation.   COORDINATION OF CARE: 1:30 AM-Discussed next steps with pt. Pt verbalized understanding and is agreeable with the plan.    Labs (all labs ordered are listed, but only abnormal results are displayed) Labs Reviewed  COMPREHENSIVE METABOLIC PANEL - Abnormal; Notable for the following:       Result Value   Glucose, Bld 102 (*)    Calcium 8.7 (*)    ALT 16 (*)    All other components within normal limits  CBC WITH DIFFERENTIAL/PLATELET  URINALYSIS, ROUTINE W REFLEX MICROSCOPIC  RAPID URINE DRUG SCREEN, HOSP PERFORMED  ETHANOL    EKG  EKG Interpretation  Date/Time:  Wednesday Feb 03 2017 01:27:29 EDT Ventricular Rate:  82 PR Interval:    QRS Duration: 74 QT Interval:  358 QTC Calculation: 419 R Axis:   40 Text Interpretation:  Sinus rhythm Probable left atrial enlargement Left ventricular hypertrophy ST elev, probable normal early repol pattern Baseline wander in lead(s) V3 No significant change since last tracing Confirmed by Timmy Cleverly  MD, Shaynna Husby [phone]) on 02/03/2017 5:06:58 AM       Radiology Ct Head Wo Contrast  Result Date: 02/03/2017 CLINICAL DATA:  Initial evaluation for acute trauma, fall down stairs. EXAM: CT HEAD WITHOUT CONTRAST CT CERVICAL SPINE WITHOUT CONTRAST TECHNIQUE: Multidetector CT imaging of the head and cervical spine was performed following the standard protocol without intravenous contrast. Multiplanar CT image reconstructions of the cervical spine were also generated. COMPARISON:  Prior CT from 04/08/2015. FINDINGS: CT HEAD FINDINGS Brain: Generalized cerebral atrophy with chronic microvascular ischemic disease. No acute intracranial  hemorrhage. No evidence for acute large vessel territory infarct. No mass lesion, midline shift or mass effect. No hydrocephalus. No extra-axial fluid collection. Vascular: No hyperdense vessel. Skull: Scalp soft tissues demonstrate no acute abnormality. Calvarium intact. Sinuses/Orbits: Chronic paranasal sinus disease noted with postsurgical changes on the left. Right frontal sinus completely opacified. No superimposed fluid level to suggests acute exacerbation. No mastoid effusion. CT CERVICAL SPINE FINDINGS Alignment: Straightening of the normal cervical lordosis. No listhesis. Mild rotation of C1 on C2 likely positional. Skull base and vertebrae: Skullbase intact. Dens intact. Vertebral body heights maintained. No acute fracture. Soft tissues and spinal canal: Soft tissues of the neck demonstrate no acute abnormality. No prevertebral edema. Carotid bifurcation calcifications noted. Disc levels: Moderate 2 advanced degenerative spondylolysis present at C3-4 through C7-T1. Upper chest: Visualized upper chest demonstrates no acute abnormality. Visualized lung apices are clear. No apical pneumothorax. IMPRESSION: CT BRAIN: 1. No acute intracranial process identified. 2. Generalized age-related cerebral atrophy with chronic microvascular ischemic changes. CT CERVICAL SPINE: 1. No acute traumatic injury within cervical spine. 2. Moderate to advanced degenerative spondylolysis at C3-4 through C7-T1. Electronically Signed   By: Jeannine Boga M.D.   On: 02/03/2017 04:56   Ct Cervical Spine Wo Contrast  Result Date: 02/03/2017 CLINICAL DATA:  Initial evaluation for acute trauma, fall down stairs. EXAM: CT HEAD WITHOUT CONTRAST CT CERVICAL SPINE WITHOUT CONTRAST TECHNIQUE: Multidetector CT imaging of the head and cervical spine was performed following the standard protocol without intravenous contrast. Multiplanar CT image reconstructions of the cervical spine were also generated. COMPARISON:  Prior CT from  04/08/2015. FINDINGS: CT HEAD FINDINGS Brain: Generalized cerebral atrophy with chronic microvascular ischemic disease. No acute intracranial hemorrhage. No evidence for acute large vessel territory infarct. No mass lesion, midline shift or mass effect. No hydrocephalus. No extra-axial fluid collection. Vascular: No hyperdense vessel. Skull: Scalp soft tissues demonstrate no acute abnormality. Calvarium intact. Sinuses/Orbits: Chronic paranasal sinus disease noted with postsurgical changes on the left. Right frontal sinus completely opacified. No superimposed fluid level to suggests acute exacerbation. No mastoid effusion. CT CERVICAL SPINE FINDINGS Alignment: Straightening of the normal cervical lordosis. No listhesis. Mild rotation of C1 on C2 likely positional. Skull base and vertebrae: Skullbase intact. Dens intact. Vertebral body heights maintained. No acute fracture. Soft tissues and spinal canal: Soft tissues of the neck demonstrate no acute abnormality. No prevertebral edema. Carotid bifurcation calcifications noted. Disc levels: Moderate 2  advanced degenerative spondylolysis present at C3-4 through C7-T1. Upper chest: Visualized upper chest demonstrates no acute abnormality. Visualized lung apices are clear. No apical pneumothorax. IMPRESSION: CT BRAIN: 1. No acute intracranial process identified. 2. Generalized age-related cerebral atrophy with chronic microvascular ischemic changes. CT CERVICAL SPINE: 1. No acute traumatic injury within cervical spine. 2. Moderate to advanced degenerative spondylolysis at C3-4 through C7-T1. Electronically Signed   By: Jeannine Boga M.D.   On: 02/03/2017 04:56   Dg Pelvis Portable  Result Date: 02/03/2017 CLINICAL DATA:  [place] down 16 steps. EXAM: PORTABLE PELVIS 1-2 VIEWS COMPARISON:  None. FINDINGS: Days supine portable view of the pelvis is negative for acute fracture. Both hips appear intact. Sacroiliac joints and pubic symphysis appear intact. No acute soft  tissue abnormality. IMPRESSION: Negative. Electronically Signed   By: Andreas Newport M.D.   On: 02/03/2017 02:14   Dg Chest Port 1 View  Result Date: 02/03/2017 CLINICAL DATA:  [place] down 16 steps tonight EXAM: PORTABLE CHEST 1 VIEW COMPARISON:  10/03/2016 FINDINGS: A single supine portable view of the chest is negative for pneumothorax or hemothorax. Mediastinal contours are normal. The lungs are clear. IMPRESSION: No acute findings Electronically Signed   By: Andreas Newport M.D.   On: 02/03/2017 02:15   Dg Foot Complete Left  Result Date: 02/03/2017 CLINICAL DATA:  [place] down 16 steps tonight EXAM: LEFT FOOT - COMPLETE 3+ VIEW COMPARISON:  None. FINDINGS: Mild degenerative first MTP joint changes. No evidence of acute fracture or dislocation. No radiopaque foreign body. IMPRESSION: Negative for acute fracture, dislocation or radiopaque foreign body Electronically Signed   By: Andreas Newport M.D.   On: 02/03/2017 02:16   Dg Foot Complete Right  Result Date: 02/03/2017 CLINICAL DATA:  [place] down 16 steps tonight EXAM: RIGHT FOOT COMPLETE - 3+ VIEW COMPARISON:  11/20/2015 FINDINGS: Fracture of the fifth metatarsal head, mildly impacted. No dislocation. No radiopaque foreign body IMPRESSION: Fifth metatarsal head fracture. Electronically Signed   By: Andreas Newport M.D.   On: 02/03/2017 02:18    Procedures Procedures (including critical care time)  Medications Ordered in ED Medications - No data to display   Initial Impression / Assessment and Plan / ED Course  I have reviewed the triage vital signs and the nursing notes.  Pertinent labs & imaging results that were available during my care of the patient were reviewed by me and considered in my medical decision making (see chart for details).     Patient presents to the emergency department after a fall. Patient reportedly fell down a flight of stairs earlier in the day. He had been experiencing pain in both his feet. He called EMS  because of the increasing pain. He also has headache. He did hit his head and he thinks he lost consciousness. EMS came concerned because during transport he seemed to change his level of consciousness. At arrival he appears sleepy but he awakens and answers questions. He has nonfocal neurologic examination. Workup was performed. CT head and cervical spine were negative. Blood work was entirely normal. Drug screen negative. Alcohol level negative. He does have a history of seizure, but did not feel like he had a seizure. Cannot rule out postictal state, however. Patient has been monitored for an extended period of time and is now awake and alert. He appears to be doing well. X-ray of both feet does reveal evidence of fracture of the fifth metatarsal head of the right foot, no abnormality on the left.  Final  Clinical Impressions(s) / ED Diagnoses   Final diagnoses:  Injury of head, initial encounter  Nondisplaced fracture of fifth metatarsal bone, right foot, initial encounter for closed fracture    New Prescriptions New Prescriptions   HYDROCODONE-ACETAMINOPHEN (NORCO/VICODIN) 5-325 MG TABLET    Take 1 tablet by mouth every 4 (four) hours as needed for moderate pain.   I personally performed the services described in this documentation, which was scribed in my presence. The recorded information has been reviewed and is accurate.     [name], MD 02/03/17 [phone]

## 2017-02-03 NOTE — ED Notes (Signed)
Called CT - pt has 3 patients in front of him for scan.

## 2017-02-03 NOTE — Progress Notes (Signed)
Orthopedic Tech Progress Note Patient Details:  [name] 12/16/1960 [id number]  Ortho Devices Type of Ortho Device: CAM walker Ortho Device/Splint Location: rle Ortho Device/Splint Interventions: Application   Yovanna Cogan 02/03/2017, 7:20 AM

## 2017-02-17 ENCOUNTER — Encounter: Payer: Self-pay | Admitting: Family Medicine

## 2017-02-23 ENCOUNTER — Ambulatory Visit (INDEPENDENT_AMBULATORY_CARE_PROVIDER_SITE_OTHER): Payer: Medicaid Other | Admitting: Orthopaedic Surgery

## 2017-03-02 ENCOUNTER — Ambulatory Visit (INDEPENDENT_AMBULATORY_CARE_PROVIDER_SITE_OTHER): Payer: Medicaid Other | Admitting: Orthopaedic Surgery

## 2017-08-31 IMAGING — CT CT RENAL STONE PROTOCOL
2 of 4 series · 16 of 46 positions shown, 18 images · non-contrast
Comparison: 04/27/2010

CLINICAL DATA: Chest pain for 4 days. Vomiting and leg weakness.
Dysuria. Back pain.

EXAM:
CT ABDOMEN AND PELVIS WITHOUT CONTRAST
TECHNIQUE: Multidetector CT imaging of the abdomen and pelvis was performed
following the standard protocol without IV contrast.

[Series 2: stone study 5.0 i30f 1 · axial · 0.68mm/px · z∈[+916,+1320]mm · 13 of 89 slices shown, 15 images]
[im 4/89  soft-tissue]
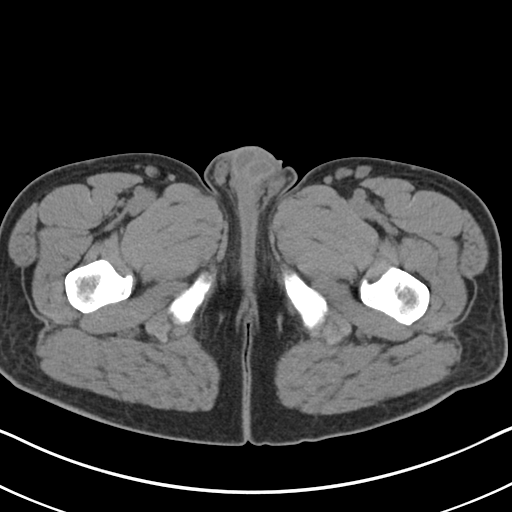
[im 4/89  bone]
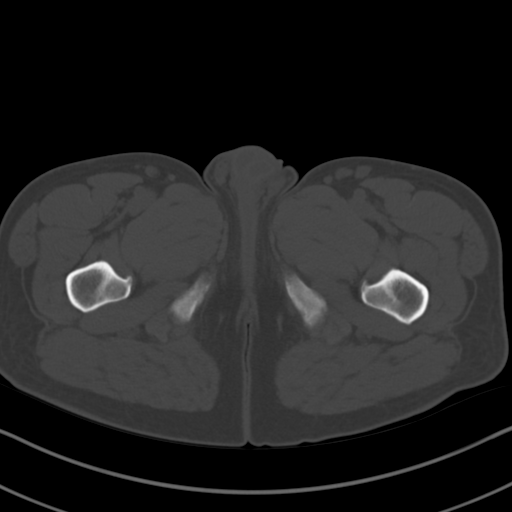
[im 12/89  soft-tissue]
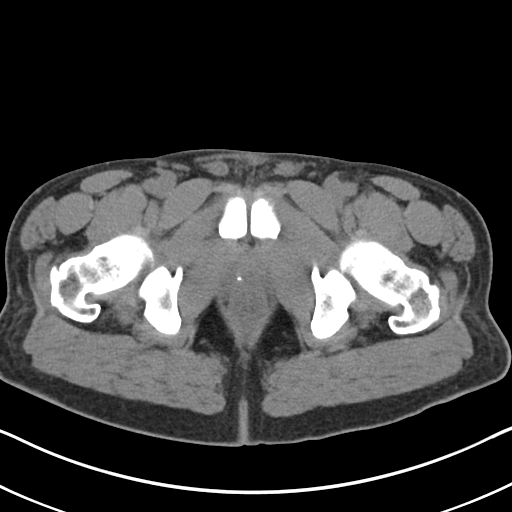
[im 20/89  soft-tissue]
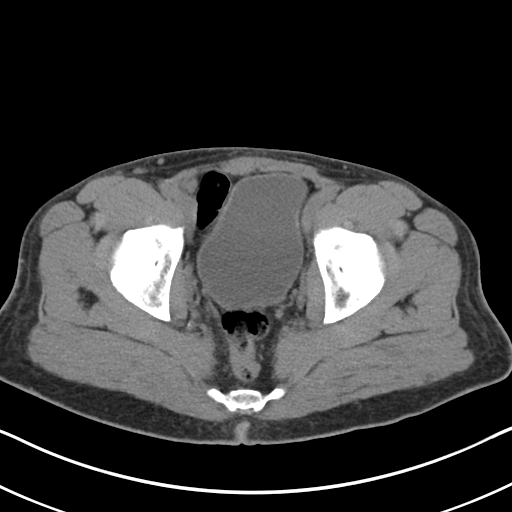
[im 23/89  soft-tissue]
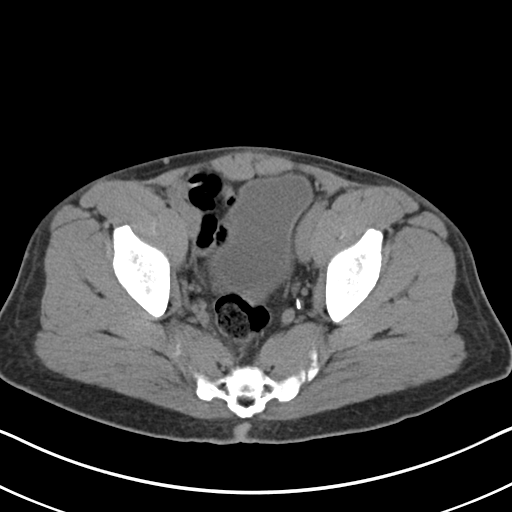
[im 31/89  soft-tissue]
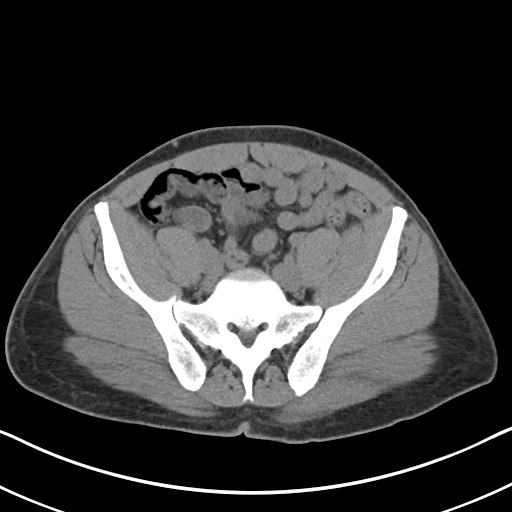
[im 39/89  soft-tissue]
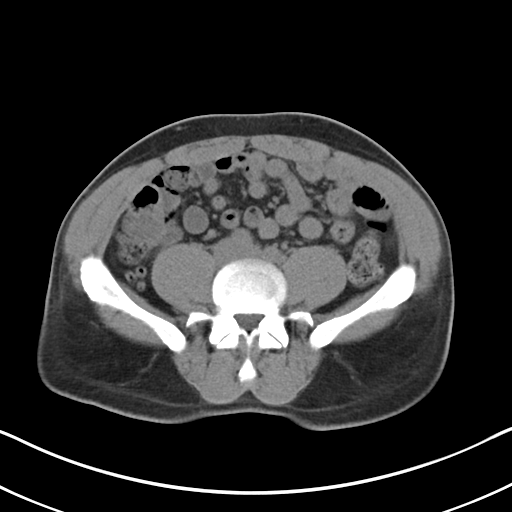
[im 46/89  soft-tissue]
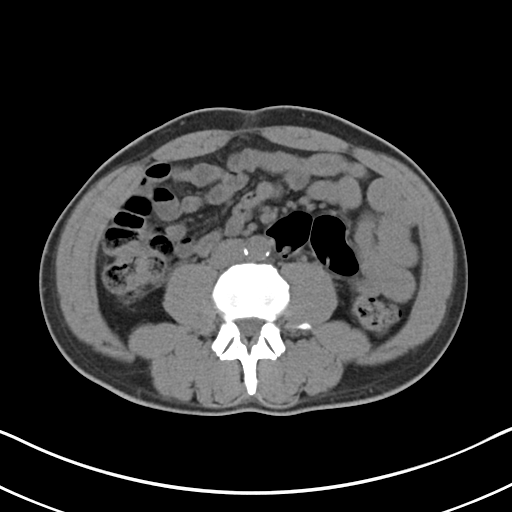
[im 50/89  soft-tissue]
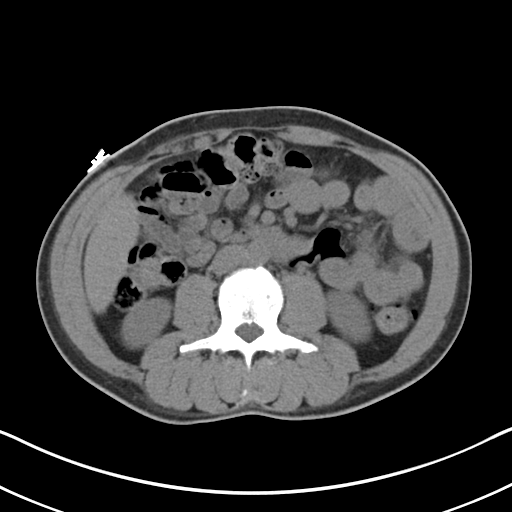
[im 58/89  soft-tissue]
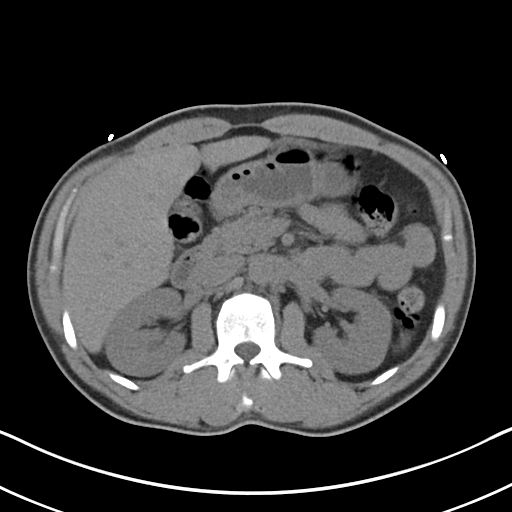
[im 58/89  bone]
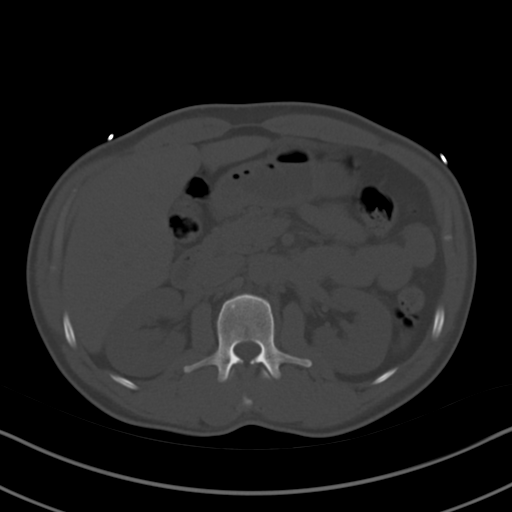
[im 66/89  soft-tissue]
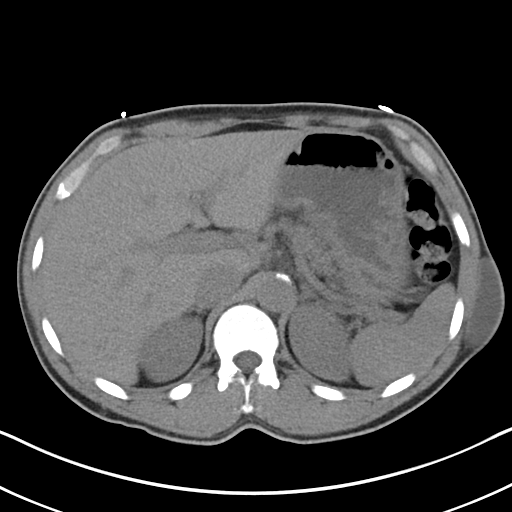
[im 69/89  soft-tissue]
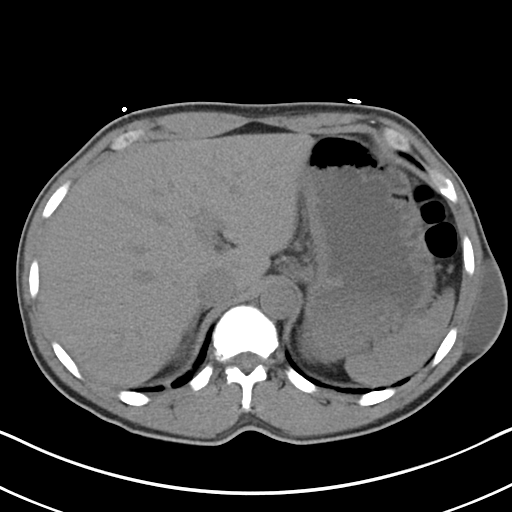
[im 77/89  soft-tissue]
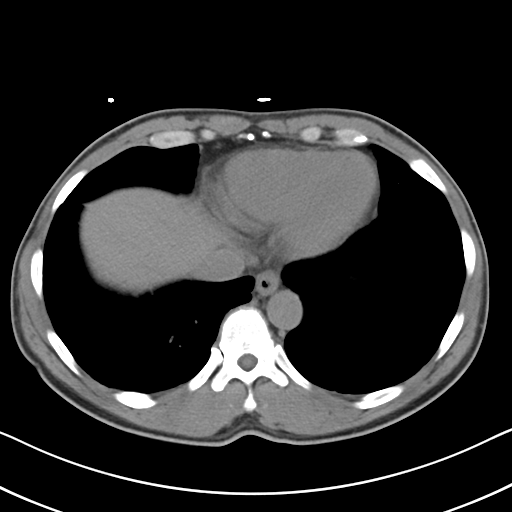
[im 85/89  soft-tissue]
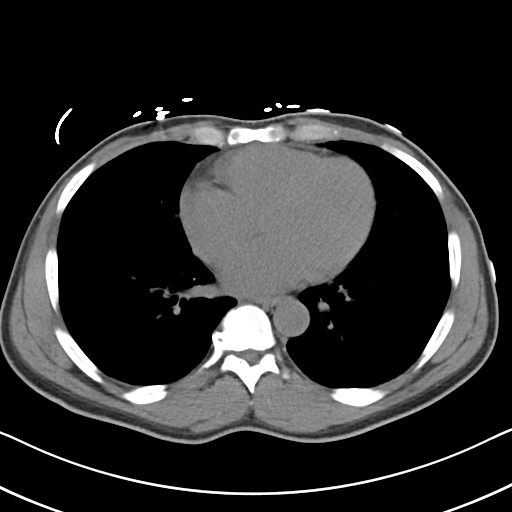

[Series 5: coronal soft tissue · coronal · 0.68mm/px · 3 of 78 slices shown]
[im 26/78  soft-tissue]
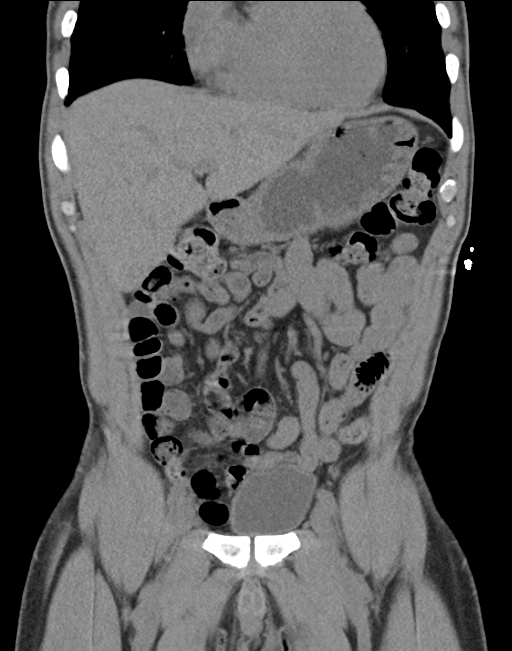
[im 35/78  soft-tissue]
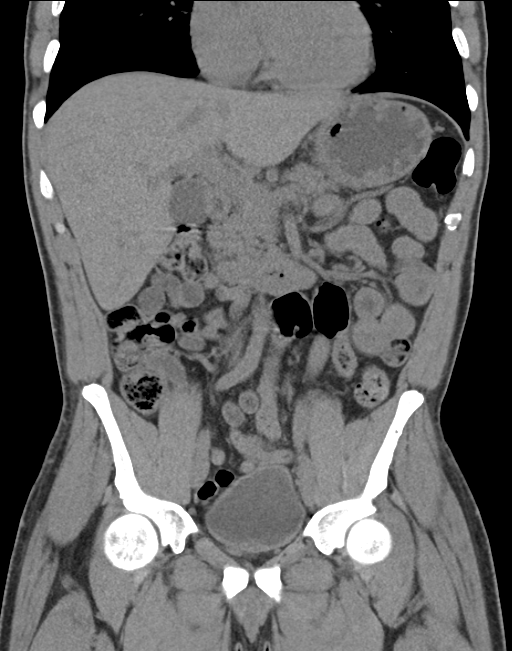
[im 43/78  soft-tissue]
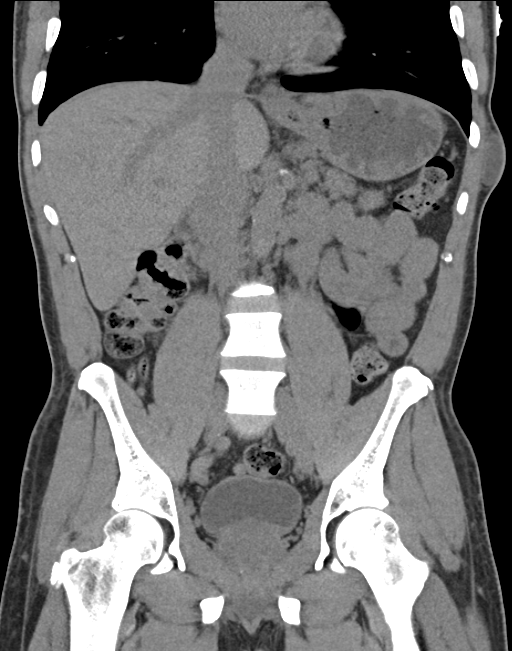

[16 of 46 positions shown; findings below may reference images not displayed]

FINDINGS: Lower chest:  Unremarkable

Hepatobiliary: Unremarkable.  Gallbladder normal.

Pancreas: Unremarkable

Spleen: Unremarkable

Adrenals/Urinary Tract: Questionable 1 mm right kidney upper pole
nonobstructive calculus, image 59/5. No hydronephrosis, hydroureter,
or visibly ureteral calculus. Urinary bladder unremarkable. Th

Stomach/Bowel: Unremarkable.  Appendix normal.

Vascular/Lymphatic: Aortoiliac atherosclerotic vascular disease.

Reproductive: Prostate gland measures approximately 5.0 by 5.1 by
4.4 cm (volume = 58.3 cc).

Other: Subcutaneous cystic lesion along the left flank measures
by 2.2 by 5.0 cm (volume = 38.9 cc), and similar in appearance to
the prior exam aside from being slightly larger.

Musculoskeletal: Mild spurring anteriorly along the sacroiliac
joints. Mild disc bulge at L4-5.
IMPRESSION: 1. A specific cause for the patient's at chest and abdominal pain is
not observed.
2. Questionable 1 mm right kidney upper pole nonobstructive
calculus.
3.  Aortoiliac atherosclerotic vascular disease.
4. Mildly enlarged prostate gland.
5. Subcutaneous cystic lesion along the left flank measuring about
39 cc in volume, slightly larger but otherwise similar appearance to
04/27/2010, possibly a sebaceous cyst.
6. Mild disc bulge at L4-5.

## 2018-06-01 ENCOUNTER — Encounter (HOSPITAL_COMMUNITY): Payer: Self-pay | Admitting: Emergency Medicine

## 2018-06-01 ENCOUNTER — Other Ambulatory Visit: Payer: Self-pay

## 2018-06-01 ENCOUNTER — Emergency Department (HOSPITAL_COMMUNITY)
Admission: EM | Admit: 2018-06-01 | Discharge: 2018-06-02 | Discharge status: Against Medical Advice | Payer: Self-pay | Attending: Emergency Medicine | Admitting: Emergency Medicine

## 2018-06-01 DIAGNOSIS — F319 Bipolar disorder, unspecified: Secondary | ICD-10-CM | POA: Insufficient documentation

## 2018-06-01 DIAGNOSIS — K0889 Other specified disorders of teeth and supporting structures: Secondary | ICD-10-CM | POA: Insufficient documentation

## 2018-06-01 DIAGNOSIS — G40909 Epilepsy, unspecified, not intractable, without status epilepticus: Secondary | ICD-10-CM | POA: Insufficient documentation

## 2018-06-01 DIAGNOSIS — Z5320 Procedure and treatment not carried out because of patient's decision for unspecified reasons: Secondary | ICD-10-CM | POA: Insufficient documentation

## 2018-06-01 DIAGNOSIS — K029 Dental caries, unspecified: Secondary | ICD-10-CM | POA: Insufficient documentation

## 2018-06-01 DIAGNOSIS — F17210 Nicotine dependence, cigarettes, uncomplicated: Secondary | ICD-10-CM | POA: Insufficient documentation

## 2018-06-01 DIAGNOSIS — I10 Essential (primary) hypertension: Secondary | ICD-10-CM | POA: Insufficient documentation

## 2018-06-01 DIAGNOSIS — I739 Peripheral vascular disease, unspecified: Secondary | ICD-10-CM | POA: Insufficient documentation

## 2018-06-01 MED ORDER — IBUPROFEN 200 MG PO TABS
600.0000 mg | ORAL_TABLET | Freq: Once | ORAL | Status: AC
  Administered 2018-06-01: 600 mg via ORAL
  Filled 2018-06-01: qty 3

## 2018-06-01 NOTE — ED Triage Notes (Signed)
Pt fnd laying on ground in front of a store. EMS and no Bystanders to give information. Pt was FND groaning, and irritated and paranoid and intermittent with periods of lucidness and shouting. Unsure if pt fell, or SZ. Pt does report Ethol use.  pt initial GCS 10 was 10.   EMS v/s 128/73 HR 96 CBG 98% RR  16

## 2018-06-01 NOTE — ED Notes (Signed)
Patient reporting dental pain, left upper.

## 2018-06-01 NOTE — ED Notes (Signed)
Patient yelling in room. Out at nurses station acting like a ninja, doing karate moves.

## 2018-06-01 NOTE — ED Provider Notes (Signed)
[facility] DEPT Provider Note   CSN: [id number] Arrival date & time: 06/01/18  2116     History   Chief Complaint Chief Complaint  Patient presents with  . Dental Pain  . Seizures    HPI [name] is a 57 y.o. male.  Patient with a history of alcohol abuse, seizures (on Dilantin), HTN, PVD, presents by EMS after being called by bystander who found the patient on the ground. Per EMS, no witnessed seizure activity, but patient states he has had 2 seizures today. The patient's complaint is focused on dental pain that started today on the left upper and lower 1st molars. No injury, bleeding, facial swelling or fever. He admits to drinking "2 beers" today. No vomiting, chest pain, SOB, other pain.  The history is provided by the patient and the EMS personnel. No language interpreter was used.    Past Medical History:  Diagnosis Date  . Alcohol abuse   . Bipolar 1 disorder ([place])   . Hepatitis    tx 80's  . Hypertension   . Peripheral vascular disease ([place])    ?"had blood clots in arms- no tx?"  . Pneumonia    hx  . Seizures ([place])    last time past 5 months  . Stomach ulcer   . Stroke [facility])    weakness rt hand    Patient Active Problem List   Diagnosis Date Noted  . 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 Cerebral infarct ([place]) 10/29/2015  . Erectile dysfunction 10/28/2015  . Hypertension 10/28/2015  . Plantar fasciitis of right foot 10/28/2015  . Alcohol use disorder, severe, dependence ([place]) 10/25/2015  . 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 MDD (major depressive disorder), recurrent episode, severe ([place]) 10/25/2015  . Vitamin D deficiency 04/15/2015  . 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 Lipoma of flank 04/12/2015  . Seizures ([place]) 04/12/2015  . Persistent headaches 04/12/2015  . Horizontal nystagmus 04/12/2015  . Hypocalcemia 04/12/2015    Past Surgical History:    Procedure Laterality Date  . BRAIN TUMOR EXCISION  90   nasal approach  . HERNIA REPAIR Left   . SINUS ENDO WITH FUSION Left 04/17/2016   Procedure: LEFT MEDIAL MAXILLECTOMY VIA ENDOSCOPIC AND CALDWELL-LUC APPROACH WITH FUSION;  Surgeon: Melida Quitter, MD;  Location: [facility];  Service: ENT;  Laterality: Left;        Home Medications    Prior to Admission medications   Medication Sig Start Date End Date Taking? Authorizing Provider  benzonatate (TESSALON) 100 MG capsule Take 1 capsule (100 mg total) by mouth 3 (three) times daily as needed for cough. 10/03/16   Bettey Costa, PA  cloNIDine (CATAPRES) 0.1 MG tablet Take 1 tablet (0.1 mg total) by mouth 2 (two) times daily. 10/28/15   Funches, Adriana Mccallum, MD  fluticasone (FLONASE) 50 MCG/ACT nasal spray Place 1 spray into both nostrils every morning.    [provider]  HYDROcodone-acetaminophen (NORCO/VICODIN) 5-325 MG tablet Take 1 tablet by mouth every 4 (four) hours as needed for moderate pain. 02/03/17   [name], MD  lisinopril-hydrochlorothiazide (PRINZIDE,ZESTORETIC) 20-12.5 MG tablet Take 1 tablet by mouth daily.    [provider]  omeprazole (PRILOSEC) 20 MG capsule Take 1 capsule (20 mg total) by mouth 2 (two) times daily. 05/12/16   Tanna Furry, MD  phenytoin (DILANTIN) 100 MG ER capsule Take 100 mg by mouth 2 (two) times daily.  [provider]  sildenafil (REVATIO) 20 MG tablet Take 20 mg by mouth daily.    [provider]  sucralfate (CARAFATE) 1 g tablet Take 1 tablet (1 g total) by mouth 4 (four) times daily. 05/12/16   Tanna Furry, MD    Family History Family History  Problem Relation Age of Onset  . Alcoholism Mother   . Alcoholism Father     Social History Social History   Tobacco Use  . Smoking status: Current Some Day Smoker    Packs/day: 2.00    Years: 25.00    Pack years: 50.00    Types: Cigarettes  . Smokeless tobacco: Current User  Substance Use  Topics  . Alcohol use: Yes    Alcohol/week: 12.0 standard drinks    Types: 12 Cans of beer per week  . Drug use: Yes    Types: Marijuana    Comment: occ Last used 6 months     Allergies   Borax   Review of Systems Review of Systems  Constitutional: Negative for chills and fever.  HENT: Positive for dental problem.   Respiratory: Negative.   Cardiovascular: Negative.   Gastrointestinal: Negative.   Musculoskeletal: Negative.   Skin: Negative.   Neurological: Positive for seizures.     Physical Exam Updated Vital Signs BP 126/78 (BP Location: Right Arm)   Pulse 96   Temp 98 F (36.7 C) (Oral)   Resp 19   SpO2 99%   Physical Exam  Constitutional: He is oriented to person, place, and time. He appears well-developed and well-nourished. No distress.  Patient awake, alert, does not appear significantly intoxicated.   HENT:  Head: Normocephalic.  Widespread dental decay. No visualized abscess or facial swelling. No intraoral injury.  Eyes: Conjunctivae are normal.  Neck: Normal range of motion. Neck supple.  Cardiovascular: Normal rate and regular rhythm.  Pulmonary/Chest: Effort normal and breath sounds normal. He has no wheezes. He has no rales.  Abdominal: Soft. Bowel sounds are normal. There is no tenderness. There is no rebound and no guarding.  Musculoskeletal: Normal range of motion.  Neurological: He is alert and oriented to person, place, and time. He has normal strength. No cranial nerve deficit or sensory deficit. He exhibits normal muscle tone. Coordination and gait normal. GCS eye subscore is 4. GCS verbal subscore is 5. GCS motor subscore is 6.  Skin: Skin is warm and dry. No rash noted.  Psychiatric: He has a normal mood and affect.  Nursing note and vitals reviewed.    ED Treatments / Results  Labs (all labs ordered are listed, but only abnormal results are displayed) Labs Reviewed  PHENYTOIN LEVEL, TOTAL  CK    EKG None  Radiology No results  found.  Procedures Procedures (including critical care time)  Medications Ordered in ED Medications  ibuprofen (ADVIL,MOTRIN) tablet 600 mg (has no administration in time range)     Initial Impression / Assessment and Plan / ED Course  I have reviewed the triage vital signs and the nursing notes.  Pertinent labs & imaging results that were available during my care of the patient were reviewed by me and considered in my medical decision making (see chart for details).     Patient here by EMS called by bystanders. ??Seizure activity today x 2 without witness to seizure or reported postictal symptoms by EMS. +ETOH in known alcoholic. C/O dental pain starting today.   He is awake and alert. Dental pain likely due to caries - no  evidence acute infection or trauma. Ibuprofen provided.   Will check Dilantin level and CK.   Patient left the department prior to completion of evaluation.   Final Clinical Impressions(s) / ED Diagnoses   Final diagnoses:  None   1. Dental pain 2. History of seizure disorder 3. History of alcoholism   ED Discharge Orders    None       Charlann Lange, Hershal Coria 06/02/18 0034    Tegeler, Gwenyth Allegra, MD 06/02/18 [phone]

## 2018-06-02 ENCOUNTER — Other Ambulatory Visit: Payer: Self-pay

## 2018-06-02 ENCOUNTER — Emergency Department (HOSPITAL_COMMUNITY)
Admission: EM | Admit: 2018-06-02 | Discharge: 2018-06-02 | Disposition: A | Discharge status: Home / Self Care | Payer: Self-pay | Attending: Emergency Medicine | Admitting: Emergency Medicine

## 2018-06-02 ENCOUNTER — Encounter (HOSPITAL_COMMUNITY): Payer: Self-pay

## 2018-06-02 DIAGNOSIS — F17210 Nicotine dependence, cigarettes, uncomplicated: Secondary | ICD-10-CM | POA: Insufficient documentation

## 2018-06-02 DIAGNOSIS — I10 Essential (primary) hypertension: Secondary | ICD-10-CM | POA: Insufficient documentation

## 2018-06-02 DIAGNOSIS — F319 Bipolar disorder, unspecified: Secondary | ICD-10-CM | POA: Insufficient documentation

## 2018-06-02 DIAGNOSIS — K0889 Other specified disorders of teeth and supporting structures: Secondary | ICD-10-CM | POA: Insufficient documentation

## 2018-06-02 DIAGNOSIS — G40909 Epilepsy, unspecified, not intractable, without status epilepticus: Secondary | ICD-10-CM | POA: Insufficient documentation

## 2018-06-02 MED ORDER — PENICILLIN V POTASSIUM 500 MG PO TABS: 500.0000 mg | ORAL_TABLET | Freq: Three times a day (TID) | ORAL | 0 refills | Status: DC

## 2018-06-02 NOTE — ED Triage Notes (Signed)
Pt came to ED after 2 seizures earlier today. Pt was seen, and walked out to "check on some stuff." Pt returned later and checked back in with the same complaints of tooth pain and seizures.

## 2018-06-02 NOTE — ED Notes (Signed)
Patient walked out and does not want his blood drawn.

## 2018-06-02 NOTE — ED Provider Notes (Signed)
[facility] DEPT Provider Note   CSN: [id number] Arrival date & time: 06/02/18  0021     History   Chief Complaint Chief Complaint  Patient presents with  . Dental Pain  . Seizures    HPI [name] is a 57 y.o. male.  Patient returns to ED shortly after eloping with no change in complaints of dental pain and seizures. He states he "had to go take care of something" and came back to finish his evaluation. Earlier he arrived EMS after they were called by bystander who found him lying on the ground. No witnessed seizure by bystander or EMS. Dental pain started today.  The history is provided by the patient. No language interpreter was used.  Dental Pain    Seizures      Past Medical History:  Diagnosis Date  . Alcohol abuse   . Bipolar 1 disorder ([place])   . Hepatitis    tx 80's  . Hypertension   . Peripheral vascular disease ([place])    ?"had blood clots in arms- no tx?"  . Pneumonia    hx  . Seizures ([place])    last time past 5 months  . Stomach ulcer   . Stroke [facility])    weakness rt hand    Patient Active Problem List   Diagnosis Date Noted  . 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 Cerebral infarct ([place]) 10/29/2015  . Erectile dysfunction 10/28/2015  . Hypertension 10/28/2015  . Plantar fasciitis of right foot 10/28/2015  . Alcohol use disorder, severe, dependence ([place]) 10/25/2015  . 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 MDD (major depressive disorder), recurrent episode, severe ([place]) 10/25/2015  . Vitamin D deficiency 04/15/2015  . 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 Lipoma of flank 04/12/2015  . Seizures ([place]) 04/12/2015  . Persistent headaches 04/12/2015  . Horizontal nystagmus 04/12/2015  . Hypocalcemia 04/12/2015    Past Surgical History:  Procedure Laterality Date  . BRAIN TUMOR EXCISION  90   nasal approach  . HERNIA REPAIR Left   . SINUS ENDO WITH FUSION  Left 04/17/2016   Procedure: LEFT MEDIAL MAXILLECTOMY VIA ENDOSCOPIC AND CALDWELL-LUC APPROACH WITH FUSION;  Surgeon: Melida Quitter, MD;  Location: [facility];  Service: ENT;  Laterality: Left;        Home Medications    Prior to Admission medications   Medication Sig Start Date End Date Taking? Authorizing Provider  benzonatate (TESSALON) 100 MG capsule Take 1 capsule (100 mg total) by mouth 3 (three) times daily as needed for cough. Patient not taking: Reported on 06/02/2018 10/03/16   Bettey Costa, [place]  cloNIDine (CATAPRES) 0.1 MG tablet Take 1 tablet (0.1 mg total) by mouth 2 (two) times daily. Patient not taking: Reported on 06/02/2018 10/28/15   [name], MD  HYDROcodone-acetaminophen (NORCO/VICODIN) 5-325 MG tablet Take 1 tablet by mouth every 4 (four) hours as needed for moderate pain. Patient not taking: Reported on 06/02/2018 02/03/17   [name], MD  omeprazole (PRILOSEC) 20 MG capsule Take 1 capsule (20 mg total) by mouth 2 (two) times daily. Patient not taking: Reported on 06/02/2018 05/12/16   Tanna Furry, MD  sucralfate (CARAFATE) 1 g tablet Take 1 tablet (1 g total) by mouth 4 (four) times daily. Patient not taking: Reported on 06/02/2018 05/12/16   Tanna Furry, MD    Family History Family History  Problem Relation Age of Onset  . Alcoholism Mother   .  Alcoholism Father     Social History Social History   Tobacco Use  . Smoking status: Current Some Day Smoker    Packs/day: 2.00    Years: 25.00    Pack years: 50.00    Types: Cigarettes  . Smokeless tobacco: Current User  Substance Use Topics  . Alcohol use: Yes    Alcohol/week: 12.0 standard drinks    Types: 12 Cans of beer per week  . Drug use: Yes    Types: Marijuana    Comment: occ Last used 6 months     Allergies   Borax   Review of Systems Review of Systems  Constitutional: Negative for chills and fever.  HENT: Positive for dental problem.   Respiratory: Negative.     Cardiovascular: Negative.   Gastrointestinal: Negative.   Musculoskeletal: Negative.   Skin: Negative.   Neurological: Positive for seizures.     Physical Exam Updated Vital Signs BP (!) 140/93 (BP Location: Left Arm)   Pulse 73   Temp 98 F (36.7 C) (Oral)   Resp 18   SpO2 98%   Physical Exam  Constitutional: He is oriented to person, place, and time. He appears well-developed and well-nourished.  HENT:  Head: Normocephalic.  Neck: Normal range of motion. Neck supple.  Cardiovascular: Normal rate and regular rhythm.  Pulmonary/Chest: Effort normal and breath sounds normal.  Abdominal: Soft. Bowel sounds are normal. There is no tenderness. There is no rebound and no guarding.  Musculoskeletal: Normal range of motion.  Neurological: He is alert and oriented to person, place, and time.  CN's 3-12 grossly intact. Speech is clear and focused. No facial asymmetry. No lateralizing weakness. Reflexes are equal. No deficits of coordination. Ambulatory without imbalance.    Skin: Skin is warm and dry. No rash noted.  Psychiatric: He has a normal mood and affect.     ED Treatments / Results  Labs (all labs ordered are listed, but only abnormal results are displayed) Labs Reviewed - No data to display  EKG None  Radiology No results found.  Procedures Procedures (including critical care time)  Medications Ordered in ED Medications - No data to display   Initial Impression / Assessment and Plan / ED Course  I have reviewed the triage vital signs and the nursing notes.  Pertinent labs & imaging results that were available during my care of the patient were reviewed by me and considered in my medical decision making (see chart for details).     Patient has not had any witnessed seizure activity or postictal period. No seizure activity after arrival to ED. He has a normal neurologic exam. Does not appear significantly intoxicated.   He is felt stable for discharge and  is encouraged to follow up with his primary care physician. Abx for poor dentition associated with pain. Dental resources for follow up provided.   Final Clinical Impressions(s) / ED Diagnoses   Final diagnoses:  None   1. Dental pain 2. History of seizures  ED Discharge Orders    None       Charlann Lange, PA-C 06/02/18 0201    [name], MD 06/02/18 [phone]

## 2018-06-02 NOTE — Discharge Instructions (Signed)
Follow up with a dentist of your choice for dental treatment. Take penicillin for any infection as prescribed.   Follow up with your doctor for recheck of possible seizures. Continue your Dilantin (phenytoin) regularly.

## 2018-10-19 ENCOUNTER — Emergency Department (HOSPITAL_COMMUNITY)
Admission: EM | Admit: 2018-10-19 | Discharge: 2018-10-19 | Disposition: A | Discharge status: Home / Self Care | Payer: Medicaid Other | Attending: Emergency Medicine | Admitting: Emergency Medicine

## 2018-10-19 ENCOUNTER — Emergency Department (HOSPITAL_COMMUNITY): Payer: Medicaid Other

## 2018-10-19 DIAGNOSIS — I10 Essential (primary) hypertension: Secondary | ICD-10-CM | POA: Diagnosis not present

## 2018-10-19 DIAGNOSIS — F17210 Nicotine dependence, cigarettes, uncomplicated: Secondary | ICD-10-CM | POA: Diagnosis not present

## 2018-10-19 DIAGNOSIS — R0981 Nasal congestion: Secondary | ICD-10-CM | POA: Insufficient documentation

## 2018-10-19 DIAGNOSIS — R05 Cough: Secondary | ICD-10-CM | POA: Diagnosis present

## 2018-10-19 DIAGNOSIS — J111 Influenza due to unidentified influenza virus with other respiratory manifestations: Secondary | ICD-10-CM | POA: Diagnosis not present

## 2018-10-19 LAB — BASIC METABOLIC PANEL
Anion gap: 11 (ref 5–15)
BUN: 5 mg/dL — AB (ref 6–20)
CO2: 22 mmol/L (ref 22–32)
CREATININE: 1 mg/dL (ref 0.61–1.24)
Calcium: 9 mg/dL (ref 8.9–10.3)
Chloride: 102 mmol/L (ref 98–111)
GFR calc Af Amer: >60 mL/min (ref >60)
GFR calc non Af Amer: >60 mL/min (ref >60)
GLUCOSE: 101 mg/dL — AB (ref 70–99)
Potassium: 4 mmol/L (ref 3.5–5.1)
Sodium: 135 mmol/L (ref 135–145)

## 2018-10-19 LAB — CBC
HCT: 40.5 % (ref 39.0–52.0)
Hemoglobin: 13 g/dL (ref 13.0–17.0)
MCH: 29.5 pg (ref 26.0–34.0)
MCHC: 32.1 g/dL (ref 30.0–36.0)
MCV: 91.8 fL (ref 80.0–100.0)
PLATELETS: 128 10*3/uL — AB (ref 150–400)
RBC: 4.41 MIL/uL (ref 4.22–5.81)
RDW: 13.2 % (ref 11.5–15.5)
WBC: 7.6 10*3/uL (ref 4.0–10.5)
nRBC: 0 % (ref 0.0–0.2)

## 2018-10-19 LAB — URINALYSIS, ROUTINE W REFLEX MICROSCOPIC
Bacteria, UA: NONE SEEN
Bilirubin Urine: NEGATIVE
Glucose, UA: NEGATIVE mg/dL
Ketones, ur: NEGATIVE mg/dL
Leukocytes, UA: NEGATIVE
Nitrite: NEGATIVE
PROTEIN: NEGATIVE mg/dL
SPECIFIC GRAVITY, URINE: 1.012 (ref 1.005–1.030)
pH: 5 (ref 5.0–8.0)

## 2018-10-19 LAB — INFLUENZA PANEL BY PCR (TYPE A & B)
Influenza A By PCR: POSITIVE — AB
Influenza B By PCR: NEGATIVE

## 2018-10-19 MED ORDER — BENZONATATE 100 MG PO CAPS
100.0000 mg | ORAL_CAPSULE | Freq: Once | ORAL | Status: AC
  Administered 2018-10-19: 100 mg via ORAL
  Filled 2018-10-19: qty 1

## 2018-10-19 MED ORDER — ONDANSETRON 4 MG PO TBDP
4.0000 mg | ORAL_TABLET | Freq: Once | ORAL | Status: AC
  Administered 2018-10-19: 4 mg via ORAL
  Filled 2018-10-19: qty 1

## 2018-10-19 MED ORDER — OSELTAMIVIR PHOSPHATE 75 MG PO CAPS: 75.0000 mg | ORAL_CAPSULE | Freq: Two times a day (BID) | ORAL | 0 refills | Status: DC

## 2018-10-19 MED ORDER — BENZONATATE 100 MG PO CAPS: 100.0000 mg | ORAL_CAPSULE | Freq: Three times a day (TID) | ORAL | 0 refills | Status: DC | PRN

## 2018-10-19 MED ORDER — ACETAMINOPHEN 500 MG PO TABS: 500.0000 mg | ORAL_TABLET | Freq: Four times a day (QID) | ORAL | 0 refills | Status: DC | PRN

## 2018-10-19 MED ORDER — ACETAMINOPHEN 325 MG PO TABS
650.0000 mg | ORAL_TABLET | Freq: Once | ORAL | Status: AC | PRN
  Administered 2018-10-19: 650 mg via ORAL
  Filled 2018-10-19: qty 2

## 2018-10-19 MED ORDER — ACETAMINOPHEN 500 MG PO TABS: 500.0000 mg | ORAL_TABLET | Freq: Four times a day (QID) | ORAL | 0 refills | Status: AC | PRN

## 2018-10-19 NOTE — ED Triage Notes (Signed)
BIB EMS from home. Pt reports flu like S/S X1 day. Fever/chills, body aches, N/V/D.

## 2018-10-19 NOTE — ED Provider Notes (Signed)
[facility] EMERGENCY DEPARTMENT Provider Note   CSN: [id number] Arrival date & time: 10/19/18  0408     History   Chief Complaint Chief Complaint  Patient presents with  . Flu like S/S    HPI [name] is a 58 y.o. male.  The history is provided by the patient. No language interpreter was used.     58 year old male with history of alcohol abuse, brought here via EMS from home for evaluation of flu symptoms.  Patient report for the past 2 days he has had persistent cough, posttussive emesis, fever, chills, myalgias, congestion, and decreased appetite.  Aside from praying no other specific treatment tried.  Unsure of any recent sick contact.  No recent travel.  He has not had his flu shot.  Patient denies any shortness of breath.  Or abdominal pain.  Patient is a heavy tobacco user and alcohol user.  No dysuria. Does endorse headache without any neck stiffness.  Past Medical History:  Diagnosis Date  . Alcohol abuse   . Bipolar 1 disorder ([place])   . Hepatitis    tx 80's  . Hypertension   . Peripheral vascular disease ([place])    ?"had blood clots in arms- no tx?"  . Pneumonia    hx  . Seizures ([place])    last time past 5 months  . Stomach ulcer   . Stroke [facility])    weakness rt hand    Patient Active Problem List   Diagnosis Date Noted  . 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 Cerebral infarct ([place]) 10/29/2015  . Erectile dysfunction 10/28/2015  . Hypertension 10/28/2015  . Plantar fasciitis of right foot 10/28/2015  . Alcohol use disorder, severe, dependence ([place]) 10/25/2015  . 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 MDD (major depressive disorder), recurrent episode, severe ([place]) 10/25/2015  . Vitamin D deficiency 04/15/2015  . 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 Lipoma of flank 04/12/2015  . Seizures ([place]) 04/12/2015  . Persistent headaches 04/12/2015  . Horizontal nystagmus 04/12/2015    . Hypocalcemia 04/12/2015    Past Surgical History:  Procedure Laterality Date  . BRAIN TUMOR EXCISION  90   nasal approach  . HERNIA REPAIR Left   . SINUS ENDO WITH FUSION Left 04/17/2016   Procedure: LEFT MEDIAL MAXILLECTOMY VIA ENDOSCOPIC AND CALDWELL-LUC APPROACH WITH FUSION;  Surgeon: Melida Quitter, MD;  Location: [facility];  Service: ENT;  Laterality: Left;        Home Medications    Prior to Admission medications   Medication Sig Start Date End Date Taking? Authorizing Provider  benzonatate (TESSALON) 100 MG capsule Take 1 capsule (100 mg total) by mouth 3 (three) times daily as needed for cough. Patient not taking: Reported on 06/02/2018 10/03/16   Bettey Costa, [place]  cloNIDine (CATAPRES) 0.1 MG tablet Take 1 tablet (0.1 mg total) by mouth 2 (two) times daily. Patient not taking: Reported on 06/02/2018 10/28/15   [name], MD  HYDROcodone-acetaminophen (NORCO/VICODIN) 5-325 MG tablet Take 1 tablet by mouth every 4 (four) hours as needed for moderate pain. Patient not taking: Reported on 06/02/2018 02/03/17   [name], MD  omeprazole (PRILOSEC) 20 MG capsule Take 1 capsule (20 mg total) by mouth 2 (two) times daily. Patient not taking: Reported on 06/02/2018 05/12/16   Tanna Furry, MD  penicillin v potassium (VEETID) 500 MG tablet Take 1 tablet (500 mg total) by mouth 3 (three) times daily. 06/02/18  Charlann Lange, PA-C  sucralfate (CARAFATE) 1 g tablet Take 1 tablet (1 g total) by mouth 4 (four) times daily. Patient not taking: Reported on 06/02/2018 05/12/16   Tanna Furry, MD    Family History Family History  Problem Relation Age of Onset  . Alcoholism Mother   . Alcoholism Father     Social History Social History   Tobacco Use  . Smoking status: Current Some Day Smoker    Packs/day: 2.00    Years: 25.00    Pack years: 50.00    Types: Cigarettes  . Smokeless tobacco: Current User  Substance Use Topics  . Alcohol use: Yes    Alcohol/week:  12.0 standard drinks    Types: 12 Cans of beer per week  . Drug use: Yes    Types: Marijuana    Comment: occ Last used 6 months     Allergies   Borax   Review of Systems Review of Systems  All other systems reviewed and are negative.    Physical Exam Updated Vital Signs BP (!) 130/105 (BP Location: Right Arm)   Temp (!) 102 F (38.9 C) (Oral)   Resp 18   SpO2 100%   Physical Exam Vitals signs and nursing note reviewed.  Constitutional:      General: He is not in acute distress.    Appearance: He is well-developed.     Comments: Appears uncomfortable, actively coughing and having posttussive emesis.  Febrile  HENT:     Head: Atraumatic.     Right Ear: Tympanic membrane normal.     Left Ear: Tympanic membrane normal.     Nose: Nose normal.     Mouth/Throat:     Mouth: Mucous membranes are moist.  Eyes:     Conjunctiva/sclera: Conjunctivae normal.  Neck:     Musculoskeletal: Neck supple. No neck rigidity.  Cardiovascular:     Rate and Rhythm: Normal rate and regular rhythm.     Pulses: Normal pulses.     Heart sounds: Normal heart sounds.  Pulmonary:     Breath sounds: No wheezing, rhonchi or rales.  Abdominal:     Palpations: Abdomen is soft.     Tenderness: There is no abdominal tenderness.  Skin:    Findings: No rash.  Neurological:     Mental Status: He is alert and oriented to person, place, and time.      ED Treatments / Results  Labs (all labs ordered are listed, but only abnormal results are displayed) Labs Reviewed  CBC - Abnormal; Notable for the following components:      Result Value   Platelets 128 (*)    All other components within normal limits  BASIC METABOLIC PANEL - Abnormal; Notable for the following components:   Glucose, Bld 101 (*)    BUN 5 (*)    All other components within normal limits  URINALYSIS, ROUTINE W REFLEX MICROSCOPIC - Abnormal; Notable for the following components:   Hgb urine dipstick SMALL (*)    All other  components within normal limits  INFLUENZA PANEL BY PCR (TYPE A & B)    EKG None  Radiology Dg Chest 2 View  Result Date: 10/19/2018 CLINICAL DATA:  Chest pain and shortness of breath. Cough and congestion EXAM: CHEST - 2 VIEW COMPARISON:  Feb 03, 2017 FINDINGS: There is no edema or consolidation. Heart size and pulmonary vascularity are normal. No adenopathy. There is aortic atherosclerosis. No evident bone lesions. IMPRESSION: Aortic atherosclerosis.  No appreciable edema  consolidation. Aortic Atherosclerosis (ICD10-I70.0). Electronically Signed   By: Lowella Grip III M.D.   On: 10/19/2018 08:30    Procedures Procedures (including critical care time)  Medications Ordered in ED Medications  acetaminophen (TYLENOL) tablet 650 mg (650 mg Oral Given 10/19/18 0651)  ondansetron (ZOFRAN-ODT) disintegrating tablet 4 mg (4 mg Oral Given 10/19/18 0800)  benzonatate (TESSALON) capsule 100 mg (100 mg Oral Given 10/19/18 0800)     Initial Impression / Assessment and Plan / ED Course  I have reviewed the triage vital signs and the nursing notes.  Pertinent labs & imaging results that were available during my care of the patient were reviewed by me and considered in my medical decision making (see chart for details).     BP (!) 119/59   Pulse 89   Temp (!) 101 F (38.3 C) (Oral)   Resp 16   SpO2 90%    Final Clinical Impressions(s) / ED Diagnoses   Final diagnoses:  Flu syndrome    ED Discharge Orders         Ordered    benzonatate (TESSALON) 100 MG capsule  3 times daily PRN     10/19/18 0844    oseltamivir (TAMIFLU) 75 MG capsule  Every 12 hours     10/19/18 0844    acetaminophen (TYLENOL) 500 MG tablet  Every 6 hours PRN     10/19/18 0844         8:27 AM Patient presents with symptoms suggestive of the flu.  He is just within the window of treatment and will benefit from Tamiflu given his symptom severity.  Chest x-ray obtained show no evidence of pneumonia. Pt  discharge home with appropriate treatment.  Return precaution given.    Domenic Moras, PA-C 10/19/18 6629    [name], MD 10/27/18 [phone]

## 2018-10-19 NOTE — ED Notes (Signed)
Patient transported to X-ray 

## 2018-10-19 NOTE — ED Notes (Signed)
Pt refused Tylenol for fever... states it "will not work" so he "doesn't want it"

## 2018-10-19 NOTE — ED Notes (Signed)
Pt verbalized understanding of discharge paperwork, prescriptions and follow-up care 

## 2018-10-19 NOTE — Discharge Instructions (Signed)
You have been diagnosed with the flu. Take Tamiflu as treatment.  Drink plenty of fluid.  Take tylenol and/or ibuprofen as needed for fever and body aches.  Take Tessalon for cough.

## 2019-10-05 ENCOUNTER — Encounter (HOSPITAL_COMMUNITY): Payer: Self-pay | Admitting: Emergency Medicine

## 2019-10-05 ENCOUNTER — Emergency Department (HOSPITAL_COMMUNITY)
Admission: EM | Admit: 2019-10-05 | Discharge: 2019-10-06 | Disposition: A | Discharge status: Home / Self Care | Payer: Medicaid Other | Attending: Emergency Medicine | Admitting: Emergency Medicine

## 2019-10-05 ENCOUNTER — Emergency Department (HOSPITAL_COMMUNITY): Payer: Medicaid Other

## 2019-10-05 ENCOUNTER — Other Ambulatory Visit: Payer: Self-pay

## 2019-10-05 DIAGNOSIS — I10 Essential (primary) hypertension: Secondary | ICD-10-CM | POA: Insufficient documentation

## 2019-10-05 DIAGNOSIS — T50901A Poisoning by unspecified drugs, medicaments and biological substances, accidental (unintentional), initial encounter: Secondary | ICD-10-CM | POA: Insufficient documentation

## 2019-10-05 DIAGNOSIS — Y902 Blood alcohol level of 40-59 mg/100 ml: Secondary | ICD-10-CM | POA: Diagnosis not present

## 2019-10-05 DIAGNOSIS — F10120 Alcohol abuse with intoxication, uncomplicated: Secondary | ICD-10-CM | POA: Diagnosis not present

## 2019-10-05 DIAGNOSIS — F17210 Nicotine dependence, cigarettes, uncomplicated: Secondary | ICD-10-CM | POA: Diagnosis not present

## 2019-10-05 DIAGNOSIS — R519 Headache, unspecified: Secondary | ICD-10-CM | POA: Insufficient documentation

## 2019-10-05 DIAGNOSIS — M546 Pain in thoracic spine: Secondary | ICD-10-CM | POA: Diagnosis not present

## 2019-10-05 DIAGNOSIS — F319 Bipolar disorder, unspecified: Secondary | ICD-10-CM | POA: Insufficient documentation

## 2019-10-05 LAB — RAPID URINE DRUG SCREEN, HOSP PERFORMED
Amphetamines: NOT DETECTED
Barbiturates: NOT DETECTED
Benzodiazepines: NOT DETECTED
Cocaine: NOT DETECTED
Opiates: NOT DETECTED
Tetrahydrocannabinol: NOT DETECTED

## 2019-10-05 LAB — ETHANOL: Alcohol, Ethyl (B): 54 mg/dL — ABNORMAL HIGH (ref <10)

## 2019-10-05 LAB — CBC WITH DIFFERENTIAL/PLATELET
Abs Immature Granulocytes: 0.03 10*3/uL (ref 0.00–0.07)
Basophils Absolute: 0.1 10*3/uL (ref 0.0–0.1)
Basophils Relative: 1 %
Eosinophils Absolute: 0.1 10*3/uL (ref 0.0–0.5)
Eosinophils Relative: 1 %
HCT: 42.7 % (ref 39.0–52.0)
Hemoglobin: 13.5 g/dL (ref 13.0–17.0)
Immature Granulocytes: 0 %
Lymphocytes Relative: 30 %
Lymphs Abs: 3 10*3/uL (ref 0.7–4.0)
MCH: 29.1 pg (ref 26.0–34.0)
MCHC: 31.6 g/dL (ref 30.0–36.0)
MCV: 92 fL (ref 80.0–100.0)
Monocytes Absolute: 0.7 10*3/uL (ref 0.1–1.0)
Monocytes Relative: 7 %
Neutro Abs: 6 10*3/uL (ref 1.7–7.7)
Neutrophils Relative %: 61 %
Platelets: 152 10*3/uL (ref 150–400)
RBC: 4.64 MIL/uL (ref 4.22–5.81)
RDW: 14.1 % (ref 11.5–15.5)
WBC: 9.9 10*3/uL (ref 4.0–10.5)
nRBC: 0 % (ref 0.0–0.2)

## 2019-10-05 LAB — COMPREHENSIVE METABOLIC PANEL
ALT: 20 U/L (ref 0–44)
AST: 25 U/L (ref 15–41)
Albumin: 3.6 g/dL (ref 3.5–5.0)
Alkaline Phosphatase: 61 U/L (ref 38–126)
Anion gap: 14 (ref 5–15)
BUN: 15 mg/dL (ref 6–20)
CO2: 21 mmol/L — ABNORMAL LOW (ref 22–32)
Calcium: 8.3 mg/dL — ABNORMAL LOW (ref 8.9–10.3)
Chloride: 105 mmol/L (ref 98–111)
Creatinine, Ser: 0.87 mg/dL (ref 0.61–1.24)
GFR calc Af Amer: >60 mL/min (ref >60)
GFR calc non Af Amer: >60 mL/min (ref >60)
Glucose, Bld: 114 mg/dL — ABNORMAL HIGH (ref 70–99)
Potassium: 3.9 mmol/L (ref 3.5–5.1)
Sodium: 140 mmol/L (ref 135–145)
Total Bilirubin: 0.4 mg/dL (ref 0.3–1.2)
Total Protein: 6.8 g/dL (ref 6.5–8.1)

## 2019-10-05 LAB — ACETAMINOPHEN LEVEL: Acetaminophen (Tylenol), Serum: <10 ug/mL — ABNORMAL LOW (ref 10–30)

## 2019-10-05 LAB — CBG MONITORING, ED: Glucose-Capillary: 133 mg/dL — ABNORMAL HIGH (ref 70–99)

## 2019-10-05 LAB — SALICYLATE LEVEL: Salicylate Lvl: <7 mg/dL — ABNORMAL LOW (ref 7.0–30.0)

## 2019-10-05 MED ORDER — LACTATED RINGERS IV BOLUS
1000.0000 mL | Freq: Once | INTRAVENOUS | Status: AC
  Administered 2019-10-05: 1000 mL via INTRAVENOUS

## 2019-10-05 MED ORDER — ONDANSETRON HCL 4 MG/2ML IJ SOLN
4.0000 mg | Freq: Once | INTRAMUSCULAR | Status: AC
  Administered 2019-10-05: 4 mg via INTRAVENOUS
  Filled 2019-10-05: qty 2

## 2019-10-05 MED ORDER — FAMOTIDINE IN NACL 20-0.9 MG/50ML-% IV SOLN
20.0000 mg | Freq: Once | INTRAVENOUS | Status: AC
  Administered 2019-10-05: 20 mg via INTRAVENOUS
  Filled 2019-10-05: qty 50

## 2019-10-05 NOTE — ED Provider Notes (Signed)
[facility] Provider Note   CSN: [id number] Arrival date & time: 10/05/19  1954     History Chief Complaint  Patient presents with  . Drug Overdose    [name] is a 58 y.o. male.  HPI [name] is a 57 y.o. male with a medical history of alcohol abuse, bipolar, hepatitis, htn who presents to the ED for drug overdose.  He presents by EMS after being found down after using heroin this evening.  Admitted to IV heroin and fentanyl around 4 PM.  EMS found him with agonal respirations and gave him Narcan IM 2mg  which improved his mental status. Placed a nasal trumpet. Required narcan 0.5 mg IV en route for decreased responsiveness. He reported to ems that he fell off the 2 ft structure he was standing on and now has a headache and upper back pain. He denies any other injuries or pain. He also admits to drinking alcohol today. He presents with nausea and actively vomiting.     Past Medical History:  Diagnosis Date  . Alcohol abuse   . Bipolar 1 disorder ([place])   . Hepatitis    tx 80's  . Hypertension   . Peripheral vascular disease ([place])    ?"had blood clots in arms- no tx?"  . Pneumonia    hx  . Seizures ([place])    last time past 5 months  . Stomach ulcer   . Stroke [facility])    weakness rt hand    Patient Active Problem List   Diagnosis Date Noted  . 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 Cerebral infarct ([place]) 10/29/2015  . Erectile dysfunction 10/28/2015  . Hypertension 10/28/2015  . Plantar fasciitis of right foot 10/28/2015  . Alcohol use disorder, severe, dependence ([place]) 10/25/2015  . 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 MDD (major depressive disorder), recurrent episode, severe ([place]) 10/25/2015  . Vitamin D deficiency 04/15/2015  . 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 Lipoma of flank 04/12/2015  . Seizures ([place]) 04/12/2015  . Persistent headaches 04/12/2015    . Horizontal nystagmus 04/12/2015  . Hypocalcemia 04/12/2015    Past Surgical History:  Procedure Laterality Date  . BRAIN TUMOR EXCISION  90   nasal approach  . HERNIA REPAIR Left   . SINUS ENDO WITH FUSION Left 04/17/2016   Procedure: LEFT MEDIAL MAXILLECTOMY VIA ENDOSCOPIC AND CALDWELL-LUC APPROACH WITH FUSION;  Surgeon: Melida Quitter, MD;  Location: [facility] OR;  Service: ENT;  Laterality: Left;       Family History  Problem Relation Age of Onset  . Alcoholism Mother   . Alcoholism Father     Social History   Tobacco Use  . Smoking status: Current Some Shylo Zamor Smoker    Packs/Harith Mccadden: 2.00    Years: 25.00    Pack years: 50.00    Types: Cigarettes  . Smokeless tobacco: Current User  Substance Use Topics  . Alcohol use: Yes    Alcohol/week: 12.0 standard drinks    Types: 12 Cans of beer per week  . Drug use: Yes    Types: Marijuana    Comment: occ Last used 6 months    Home Medications Prior to Admission medications   Medication Sig Start Date End Date Taking? Authorizing Provider  acetaminophen (TYLENOL) 500 MG tablet Take 1 tablet (500 mg total) by mouth every 6 (six) hours as needed. 10/19/18   Domenic Moras, PA-C  benzonatate (TESSALON) 100 MG capsule  Take 1 capsule (100 mg total) by mouth 3 (three) times daily as needed for cough. 10/19/18   Domenic Moras, PA-C  oseltamivir (TAMIFLU) 75 MG capsule Take 1 capsule (75 mg total) by mouth every 12 (twelve) hours. 10/19/18   Domenic Moras, PA-C  penicillin v potassium (VEETID) 500 MG tablet Take 1 tablet (500 mg total) by mouth 3 (three) times daily. 06/02/18   Charlann Lange, PA-C    Allergies    Borax  Review of Systems   Review of Systems  Unable to perform ROS: Mental status change  Constitutional: Negative for fever.  Cardiovascular: Negative for chest pain.  Gastrointestinal: Positive for nausea and vomiting.  Psychiatric/Behavioral: Positive for confusion.  All other systems reviewed and are negative.   Physical  Exam Updated Vital Signs BP (!) 164/80 (BP Location: Right Arm)   Pulse 82   Temp 98.2 F (36.8 C) (Oral)   Resp 12   SpO2 92%   Physical Exam Vitals and nursing note reviewed.  Constitutional:      General: He is in acute distress.     Appearance: Normal appearance. He is well-developed. He is not diaphoretic.     Comments: Male appears stated age, obtunded, appears intoxicated, nasal trumpet in place, on room air with no respiratory distress, arousable to voice  HENT:     Head: Normocephalic and atraumatic.     Right Ear: External ear normal.     Left Ear: External ear normal.     Nose: Nose normal. No congestion.     Mouth/Throat:     Mouth: Mucous membranes are moist.  Eyes:     General:        Right eye: No discharge.        Left eye: No discharge.     Conjunctiva/sclera: Conjunctivae normal.  Cardiovascular:     Rate and Rhythm: Normal rate and regular rhythm.     Pulses: Normal pulses.     Heart sounds: Normal heart sounds. No murmur.  Pulmonary:     Effort: Pulmonary effort is normal. No respiratory distress.     Breath sounds: Normal breath sounds. No wheezing or rales.  Abdominal:     General: Abdomen is flat. There is no distension.     Palpations: Abdomen is soft.     Tenderness: There is no abdominal tenderness.  Musculoskeletal:        General: No signs of injury. Normal range of motion.     Cervical back: Normal range of motion and neck supple.  Skin:    General: Skin is warm and dry.     Capillary Refill: Capillary refill takes less than 2 seconds.  Neurological:     General: No focal deficit present.     Mental Status: He is alert. Mental status is at baseline.  Psychiatric:        Mood and Affect: Mood normal.        Behavior: Behavior normal.     ED Results / Procedures / Treatments   Labs (all labs ordered are listed, but only abnormal results are displayed) Labs Reviewed  COMPREHENSIVE METABOLIC PANEL - Abnormal; Notable for the following  components:      Result Value   CO2 21 (*)    Glucose, Bld 114 (*)    Calcium 8.3 (*)    All other components within normal limits  SALICYLATE LEVEL - Abnormal; Notable for the following components:   Salicylate Lvl [id number] (*)    All other  components within normal limits  ACETAMINOPHEN LEVEL - Abnormal; Notable for the following components:   Acetaminophen (Tylenol), Serum <10 (*)    All other components within normal limits  ETHANOL - Abnormal; Notable for the following components:   Alcohol, Ethyl (B) 54 (*)    All other components within normal limits  CBG MONITORING, ED - Abnormal; Notable for the following components:   Glucose-Capillary 133 (*)    All other components within normal limits  RAPID URINE DRUG SCREEN, HOSP PERFORMED  CBC WITH DIFFERENTIAL/PLATELET    EKG EKG Interpretation  Date/Time:  Thursday October 05 2019 22:39:25 EST Ventricular Rate:  94 PR Interval:    QRS Duration: 89 QT Interval:  361 QTC Calculation: 452 R Axis:     Text Interpretation: Sinus rhythm Atrial premature complex Probable left atrial enlargement since last tracing no significant change Confirmed by Malvin Johns [phone]) on 10/06/2019 12:07:48 AM Also confirmed by Malvin Johns [phone]), editor Hattie Perch ([phone])  on 10/07/2019 12:50:04 PM   Radiology CT HEAD WO CONTRAST  Result Date: 10/05/2019 CLINICAL DATA:  Intravenous drug abuse.  Found unresponsive. EXAM: CT HEAD WITHOUT CONTRAST TECHNIQUE: Contiguous axial images were obtained from the base of the skull through the vertex without intravenous contrast. COMPARISON:  02/03/2017 FINDINGS: Brain: No sign of acute infarction. Chronic small-vessel ischemic changes affect the cerebral hemispheric white matter. No mass lesion, hemorrhage, hydrocephalus or extra-axial collection. Vascular: There is atherosclerotic calcification of the major vessels at the base of the brain. Skull: Negative Sinuses/Orbits: Chronic left maxillary  sinusitis. Previous functional endoscopic sinus surgery on the left. Scattered opacified left ethmoid air cells. Other: None IMPRESSION: No acute intracranial finding. Chronic small-vessel ischemic changes of the hemispheric white matter. Electronically Signed   By: Nelson Chimes M.D.   On: 10/05/2019 21:12   CT Cervical Spine Wo Contrast  Result Date: 10/05/2019 CLINICAL DATA:  IV drug abuse.  Found unresponsive. EXAM: CT CERVICAL SPINE WITHOUT CONTRAST TECHNIQUE: Multidetector CT imaging of the cervical spine was performed without intravenous contrast. Multiplanar CT image reconstructions were also generated. COMPARISON:  06/15/2010 FINDINGS: Alignment: Straightening of the normal cervical lordosis. Skull base and vertebrae: No fracture or primary bone lesion. Soft tissues and spinal canal: Negative Disc levels: Chronic degenerative spondylosis from C3-4 through C7-T1 with disc space narrowing and endplate osteophytes. No compressive narrowing of the central canal. Foraminal encroachment by osteophytes throughout the region. Upper chest: Areas of patchy pulmonary density which are nonspecific. Other: None IMPRESSION: No acute cervical spine finding. Chronic degenerative spondylosis throughout the cervical region with osteophytic encroachment upon the foramina. No compressive central canal stenosis. Electronically Signed   By: Nelson Chimes M.D.   On: 10/05/2019 21:11   CT Thoracic Spine Wo Contrast  Result Date: 10/05/2019 CLINICAL DATA:  Intravenous drug abuse.  Found unresponsive. EXAM: CT THORACIC SPINE WITHOUT CONTRAST TECHNIQUE: Multidetector CT images of the thoracic were obtained using the standard protocol without intravenous contrast. COMPARISON:  None. FINDINGS: Alignment: Normal Vertebrae: No evidence of regional fracture or destructive lesion. Paraspinal and other soft tissues: Areas of patchy pulmonary density which are nonspecific. No pleural fluid. Disc levels: No significant disc level  pathology. Mild facet and ligamentous prominence at T9-10 but no compressive stenosis. No sign of facet arthropathy. IMPRESSION: Negative CT scan of the thoracic spine. Electronically Signed   By: Nelson Chimes M.D.   On: 10/05/2019 21:09    Procedures Procedures (including critical care time)  Medications Ordered in ED Medications  famotidine (PEPCID)  IVPB 20 mg premix (0 mg Intravenous Stopped 10/05/19 2242)  ondansetron (ZOFRAN) injection 4 mg (4 mg Intravenous Given 10/05/19 2130)  lactated ringers bolus 1,000 mL (0 mLs Intravenous Stopped 10/05/19 2352)    ED Course  I have reviewed the triage vital signs and the nursing notes.  Pertinent labs & imaging results that were available during my care of the patient were reviewed by me and considered in my medical decision making (see chart for details).    MDM Rules/Calculators/A&P                      [name] is a 58 y.o. male with a medical history of alcohol abuse, bipolar, hepatitis, htn who presents to the ED for drug overdose.  He presents by EMS after being found down after using heroin this evening.  Admitted to IV heroin and fentanyl around 4 PM.  EMS found him with agonal respirations and gave him Narcan IM 2mg  which improved his mental status. Placed a nasal trumpet. Required narcan 0.5 mg IV en route for decreased responsiveness. He presents with nausea and actively vomiting.  HPI and physical exam as above. Male appears stated age, obtunded, appears intoxicated, nasal trumpet in place, on room air with no respiratory distress, arousable to voice, hemodynamically stable, afebrile.  He was treated with a bolus of IV fluids, nausea medicine and Pepcid for home use.  We obtained imaging due to his fall and altered mental status.  CT thoracic and C-spine are unremarkable, CT head.  His ECG is nonischemic, normal rate and rhythm.  Screening laboratory work is reassuring.  During his time in the ED he has progressively  become more awake and alert and interactive but at the time of my signout he needs to metabolize before being medically clear.  This plan was discussed with him at signout.  Pt care was handed off to [employer] at 0001.  Complete history and physical and current plan have been communicated.  Please refer to their note for the remainder of ED care and ultimate disposition. The above statements and care plan were discussed with my attending physician Dr Tamera Punt who voiced agreement with plan.     Final Clinical Impression(s) / ED Diagnoses Final diagnoses:  Accidental drug overdose, initial encounter    Rx / DC Orders ED Discharge Orders    None       Mackensie Pilson, Lovena Le, MD 10/07/19 [place]    Malvin Johns, MD 10/11/19 [phone]

## 2019-10-05 NOTE — ED Notes (Signed)
Pt has been sitting on the bed side commode for approximately 30 minutes. The Pt. Refuses to get off and says he still has to go and wants to stay on. Pt is alert and oriented

## 2019-10-05 NOTE — ED Notes (Signed)
Pt. Had trouble with NG tube, resp was in 40's and tripoding. but 94% O2 Repositioned pt. And he returned to normal

## 2019-10-05 NOTE — ED Triage Notes (Signed)
Pt BIB [facility], reports doing IV heroin and fentanyl between 4p-5p. EMS found pt agonal, given 2mg  IN narcan with some improvement. Given an additional 0.5mg  IV narcan. NPA placed pta, pt responsive to voice. EMS reports pt also suffered a fall, hitting his head.

## 2019-10-05 NOTE — ED Notes (Signed)
Just got back from x ray approx 5 min ago

## 2019-10-06 NOTE — ED Notes (Signed)
Pt cursing, states, "I need to get the hell up out of hear 'cause I got 4 kids." Pt asking to use phone. This RN gave pt phone pt also states, "I'm keeping my ass off those." and points to cardiac monitor and leads.

## 2019-10-06 NOTE — ED Notes (Signed)
Pt. Continues to nod off quick and is startled every time upon waking. Pt. Is oriented to person and place. Vitals WDL

## 2019-10-06 NOTE — ED Notes (Signed)
Pt. Took off c collar, disconnected all leads and got dressed on his own

## 2019-10-06 NOTE — ED Notes (Signed)
Had just rounded and put eyes on Pt. Vitals ok, Still having trouble with getting a good pleth on Pulse ox, will continue to monitor

## 2019-10-06 NOTE — ED Notes (Signed)
Finally got pt convinced to get back in bed and off camode. Pt. Pt. Has somwe episodes of apnea but Vitals WDL. Pt continues mto nod off quickly and suprisewd each time awaked. Pt. Is oriented to person and place and month. Can follow commands

## 2019-10-06 NOTE — Discharge Instructions (Addendum)
Return here as needed.  Follow-up with your primary doctor. °

## 2022-11-09 ENCOUNTER — Ambulatory Visit: Payer: Commercial Managed Care - HMO | Admitting: Neurology

## 2022-11-13 ENCOUNTER — Other Ambulatory Visit (HOSPITAL_COMMUNITY): Payer: Self-pay | Admitting: Nurse Practitioner

## 2022-11-13 DIAGNOSIS — I639 Cerebral infarction, unspecified: Secondary | ICD-10-CM

## 2022-11-23 ENCOUNTER — Other Ambulatory Visit: Payer: Self-pay

## 2022-11-23 ENCOUNTER — Emergency Department (HOSPITAL_COMMUNITY): Payer: Medicaid Other

## 2022-11-23 ENCOUNTER — Encounter (HOSPITAL_COMMUNITY): Payer: Self-pay

## 2022-11-23 ENCOUNTER — Emergency Department (HOSPITAL_COMMUNITY)
Admission: EM | Admit: 2022-11-23 | Discharge: 2022-11-23 | Discharge status: Against Medical Advice | Payer: Medicaid Other | Attending: Internal Medicine | Admitting: Internal Medicine

## 2022-11-23 ENCOUNTER — Ambulatory Visit (HOSPITAL_COMMUNITY): Payer: Medicaid Other

## 2022-11-23 DIAGNOSIS — I6782 Cerebral ischemia: Secondary | ICD-10-CM | POA: Diagnosis not present

## 2022-11-23 DIAGNOSIS — Z1152 Encounter for screening for COVID-19: Secondary | ICD-10-CM | POA: Diagnosis not present

## 2022-11-23 DIAGNOSIS — I700 Atherosclerosis of aorta: Secondary | ICD-10-CM | POA: Diagnosis not present

## 2022-11-23 DIAGNOSIS — R200 Anesthesia of skin: Secondary | ICD-10-CM

## 2022-11-23 DIAGNOSIS — Z8673 Personal history of transient ischemic attack (TIA), and cerebral infarction without residual deficits: Secondary | ICD-10-CM | POA: Diagnosis not present

## 2022-11-23 DIAGNOSIS — R29898 Other symptoms and signs involving the musculoskeletal system: Secondary | ICD-10-CM

## 2022-11-23 DIAGNOSIS — M79603 Pain in arm, unspecified: Secondary | ICD-10-CM | POA: Diagnosis present

## 2022-11-23 DIAGNOSIS — I10 Essential (primary) hypertension: Secondary | ICD-10-CM | POA: Diagnosis not present

## 2022-11-23 DIAGNOSIS — R2990 Unspecified symptoms and signs involving the nervous system: Secondary | ICD-10-CM | POA: Diagnosis present

## 2022-11-23 DIAGNOSIS — Z79899 Other long term (current) drug therapy: Secondary | ICD-10-CM | POA: Diagnosis not present

## 2022-11-23 LAB — RETICULOCYTES
Immature Retic Fract: 21.5 % — ABNORMAL HIGH (ref 2.3–15.9)
RBC.: 4.27 MIL/uL (ref 4.22–5.81)
Retic Count, Absolute: 67 10*3/uL (ref 19.0–186.0)
Retic Ct Pct: 1.6 % (ref 0.4–3.1)

## 2022-11-23 LAB — URINALYSIS, ROUTINE W REFLEX MICROSCOPIC
Bilirubin Urine: NEGATIVE
Glucose, UA: NEGATIVE mg/dL
Hgb urine dipstick: NEGATIVE
Ketones, ur: NEGATIVE mg/dL
Leukocytes,Ua: NEGATIVE
Nitrite: NEGATIVE
Protein, ur: NEGATIVE mg/dL
Specific Gravity, Urine: 1.018 (ref 1.005–1.030)
pH: 5 (ref 5.0–8.0)

## 2022-11-23 LAB — RAPID URINE DRUG SCREEN, HOSP PERFORMED
Amphetamines: NOT DETECTED
Barbiturates: NOT DETECTED
Benzodiazepines: NOT DETECTED
Cocaine: NOT DETECTED
Opiates: NOT DETECTED
Tetrahydrocannabinol: NOT DETECTED

## 2022-11-23 LAB — DIFFERENTIAL
Abs Immature Granulocytes: 0.02 10*3/uL (ref 0.00–0.07)
Basophils Absolute: 0.1 10*3/uL (ref 0.0–0.1)
Basophils Relative: 1 %
Eosinophils Absolute: 0.4 10*3/uL (ref 0.0–0.5)
Eosinophils Relative: 5 %
Immature Granulocytes: 0 %
Lymphocytes Relative: 27 %
Lymphs Abs: 2 10*3/uL (ref 0.7–4.0)
Monocytes Absolute: 0.7 10*3/uL (ref 0.1–1.0)
Monocytes Relative: 9 %
Neutro Abs: 4.3 10*3/uL (ref 1.7–7.7)
Neutrophils Relative %: 58 %

## 2022-11-23 LAB — FOLATE: Folate: 9.6 ng/mL (ref >5.9)

## 2022-11-23 LAB — APTT: aPTT: 30 seconds (ref 24–36)

## 2022-11-23 LAB — PROTIME-INR
INR: 1 (ref 0.8–1.2)
Prothrombin Time: 12.8 seconds (ref 11.4–15.2)

## 2022-11-23 LAB — CBC
HCT: 39 % (ref 39.0–52.0)
Hemoglobin: 12.3 g/dL — ABNORMAL LOW (ref 13.0–17.0)
MCH: 28.8 pg (ref 26.0–34.0)
MCHC: 31.5 g/dL (ref 30.0–36.0)
MCV: 91.3 fL (ref 80.0–100.0)
Platelets: 172 10*3/uL (ref 150–400)
RBC: 4.27 MIL/uL (ref 4.22–5.81)
RDW: 14.2 % (ref 11.5–15.5)
WBC: 7.5 10*3/uL (ref 4.0–10.5)
nRBC: 0 % (ref 0.0–0.2)

## 2022-11-23 LAB — COMPREHENSIVE METABOLIC PANEL
ALT: 22 U/L (ref 0–44)
AST: 20 U/L (ref 15–41)
Albumin: 4 g/dL (ref 3.5–5.0)
Alkaline Phosphatase: 83 U/L (ref 38–126)
Anion gap: 5 (ref 5–15)
BUN: 10 mg/dL (ref 6–20)
CO2: 26 mmol/L (ref 22–32)
Calcium: 8.7 mg/dL — ABNORMAL LOW (ref 8.9–10.3)
Chloride: 106 mmol/L (ref 98–111)
Creatinine, Ser: 0.71 mg/dL (ref 0.61–1.24)
GFR, Estimated: >60 mL/min (ref >60)
Glucose, Bld: 100 mg/dL — ABNORMAL HIGH (ref 70–99)
Potassium: 4.3 mmol/L (ref 3.5–5.1)
Sodium: 137 mmol/L (ref 135–145)
Total Bilirubin: 0.4 mg/dL (ref 0.3–1.2)
Total Protein: 7.3 g/dL (ref 6.5–8.1)

## 2022-11-23 LAB — TSH: TSH: 0.828 u[IU]/mL (ref 0.350–4.500)

## 2022-11-23 LAB — RESP PANEL BY RT-PCR (RSV, FLU A&B, COVID)  RVPGX2
Influenza A by PCR: NEGATIVE
Influenza B by PCR: NEGATIVE
Resp Syncytial Virus by PCR: NEGATIVE
SARS Coronavirus 2 by RT PCR: NEGATIVE

## 2022-11-23 LAB — ETHANOL: Alcohol, Ethyl (B): <10 mg/dL (ref <10)

## 2022-11-23 LAB — MAGNESIUM: Magnesium: 2 mg/dL (ref 1.7–2.4)

## 2022-11-23 LAB — PHOSPHORUS: Phosphorus: 4.1 mg/dL (ref 2.5–4.6)

## 2022-11-23 LAB — CK: Total CK: 181 U/L (ref 49–397)

## 2022-11-23 MED ORDER — IOHEXOL 350 MG/ML SOLN
75.0000 mL | Freq: Once | INTRAVENOUS | Status: AC | PRN
  Administered 2022-11-23: 75 mL via INTRAVENOUS

## 2022-11-23 NOTE — H&P (Incomplete)
[name] [id number] DOB: 04-27-1961 DOA: 11/23/2022  PCP: [name], MD   Outpatient Specialists: * NONE CARDS: * Dr. NEphrology: *  Dr. NEurology *   Dr. Pulmonary *  Dr.  Oncology * Dr. Fabienne Bruns* Dr.  Sadie Haber, [facility]) No care team member to display Urology Dr. *  Patient arrived to ER on 11/23/22 at 1733 Referred by Attending Audley Hose, MD   Patient coming from:    home Lives  With SO    Chief Complaint:   Chief Complaint  Patient presents with   Arm Pain    HPI: [name] is a 62 y.o. male with medical history significant of seizure disorder, alcohol abuse history, bipolar disorder, chronic gastritis, history of left maxillary tumor status post resection    Presented with left arm tingling pain and weakness Pt had a CVA 2 wks ago and his PCP sent him to ER now to get MRI   Reports left arm pain/tingling since CVA Known history of seizures hypertension alcohol abuse Patient states that he thinks he had a stroke because he had electric shocklike pain down his left arm and weakness and numbness and neuropathic leg pain no trouble speaking no trouble swallowing he has baseline left-sided numbness secondary to 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 Denies any fevers chills no ataxia no trouble walking no trouble urinating  Still smokes a pack a day  Does not drink EtOh Has been quitting over 1 year Last drink was January  On dilantin meds for seizure takes twice a day he is unsure of dose    Last was 3 m ago      Initial COVID TEST  NEGATIVE   Lab Results  Component Value Date   [place] 11/23/2022     Regarding pertinent Chronic problems:    ****Hyperlipidemia - *on statins {statin:315258}  Lipid Panel     Component Value Date/Time   CHOL 161 04/12/2015 1009   TRIG 97 04/12/2015 1009   HDL 79 04/12/2015 1009   CHOLHDL 2.0 04/12/2015 1009   VLDL 19 04/12/2015 1009   LDLCALC 63 04/12/2015 1009    ***HTN on     *** CAD  - On Aspirin, statin, betablocker, Plavix                 - *followed by cardiology                - last cardiac cath  The ASCVD Risk score (Arnett DK, et al., 2019) failed to calculate for the following reasons:   The patient has a prior MI or stroke diagnosis     *** Asthma -well *** controlled on home inhalers/ nebs f                        ***last no prior***admission  ***                       No ***history of intubation  *** COPD - not **followed by pulmonology *** not  on baseline oxygen  *L,    *** OSA -on nocturnal oxygen, *CPAP, *noncompliant with CPAP  *** Hx of CVA - *with/out residual deficits on Aspirin 81 mg, 325, Plavix    Chronic anemia - baseline hg Hemoglobin & Hematocrit  Recent Labs    11/23/22 2020  HGB 12.3*     While in ER: Clinical Course as of 11/23/22 2226  [facility]  Nov 23, 2022  2107 Labs including CMP, CBC, EtOH, PT INR, PTT, unremarkable [HN]  2205 CT ANGIO HEAD NECK W WO CM 1. No acute head CT finding. Mild chronic small-vessel ischemic change of the white matter. 2. No intracranial large vessel occlusion or proximal stenosis. 3. Aortic atherosclerosis. 4. Atherosclerotic change at both carotid bifurcations but no stenosis. 5. Previous left maxillary resection with regional soft tissue thickening and chronic bony changes. Soft tissue along the margins of the previous resection appears more prominent than on older studies, such as 02/03/2017. It may be prudent for subsequent ENT referral for re-evaluation, particularly if the previous pathology was that of inverting papilloma.   [HN]  2205 No LVO on CTA. Consulted to neurology. [HN]    Clinical Course User Index [HN] Audley Hose, MD   Talked to neurology     Ordered  CT HEAD *** NON acute  CXR - ***NON acute  CTabd/pelvis - ***nonacute  CTA chest - ***nonacute, no PE, * no evidence of infiltrate  Following Medications were ordered in ER: Medications  iohexol  (OMNIPAQUE) 350 MG/ML injection 75 mL (75 mLs Intravenous Contrast Given 11/23/22 2128)    _______________________________________________________ ER Provider Called: Neurology Dr.Lindzen They Recommend admit to medicine   Will see when he arrives   ED Triage Vitals  Enc Vitals Group     BP 11/23/22 1739 (!) 160/89     Pulse Rate 11/23/22 1739 89     Resp 11/23/22 1739 18     Temp 11/23/22 1739 98.1 F (36.7 C)     Temp Source 11/23/22 1739 Oral     SpO2 11/23/22 1739 100 %     Weight 11/23/22 1740 180 lb (81.6 kg)     Height 11/23/22 1740 5' 6"$  (1.676 m)     Head Circumference --      Peak Flow --      Pain Score 11/23/22 1739 4     Pain Loc --      Pain Edu? --      Excl. in [place]? --   TMAX(24)@     _________________________________________ Significant initial  Findings: Abnormal Labs Reviewed  CBC - Abnormal; Notable for the following components:      Result Value   Hemoglobin 12.3 (*)    All other components within normal limits  COMPREHENSIVE METABOLIC PANEL - Abnormal; Notable for the following components:   Glucose, Bld 100 (*)    Calcium 8.7 (*)    All other components within normal limits   ___________________ Troponin ***ordered ECG: Ordered Personally reviewed and interpreted by me showing: HR : 66 Sinus   no evidence of ischemic changes QTC 476   ____________________ This patient meets SIRS Criteria and may be septic.    The recent clinical data is shown below. Vitals:   11/23/22 1915 11/23/22 2030 11/23/22 2045 11/23/22 2052  BP: (!) 147/78 (!) 158/73 119/69 119/69  Pulse:    65  Resp:  20 14 15  $ Temp:    97.7 F (36.5 C)  TempSrc:      SpO2:    94%  Weight:      Height:        WBC     Component Value Date/Time   WBC 7.5 11/23/2022 2020   LYMPHSABS 2.0 11/23/2022 2020   MONOABS 0.7 11/23/2022 2020   EOSABS 0.4 11/23/2022 2020   BASOSABS 0.1 11/23/2022 2020    Lactic Acid, Venous No results found for: "LATICACIDVEN"  Procalcitonin  *** Ordered Lactic Acid, Venous No results found for: "LATICACIDVEN"   Procalcitonin *** Ordered   UA *** no evidence of UTI  ***Pending ***not ordered   Urine analysis:    Component Value Date/Time   COLORURINE YELLOW 11/23/2022 2052   APPEARANCEUR CLEAR 11/23/2022 2052   LABSPEC 1.018 11/23/2022 2052   PHURINE 5.0 11/23/2022 2052   GLUCOSEU NEGATIVE 11/23/2022 2052   HGBUR NEGATIVE 11/23/2022 2052   [place] NEGATIVE 11/23/2022 2052   [place] NEGATIVE 11/23/2022 2052   PROTEINUR NEGATIVE 11/23/2022 2052   UROBILINOGEN 0.2 07/11/2010 2012   NITRITE NEGATIVE 11/23/2022 2052   LEUKOCYTESUR NEGATIVE 11/23/2022 2052    Results for orders placed or performed during the hospital encounter of 11/23/22  Resp panel by RT-PCR (RSV, Flu A&B, Covid) Anterior Nasal Swab     Status: None   Collection Time: 11/23/22  8:53 PM   Specimen: Anterior Nasal Swab  Result Value Ref Range Status   SARS Coronavirus 2 by RT PCR NEGATIVE NEGATIVE Final         Influenza A by PCR NEGATIVE NEGATIVE Final   Influenza B by PCR NEGATIVE NEGATIVE Final         Resp Syncytial Virus by PCR NEGATIVE NEGATIVE Final          _______________________________________________ Hospitalist was called for admission for left arm weakness   The following Work up has been ordered so far:  Orders Placed This Encounter  Procedures   Resp panel by RT-PCR (RSV, Flu A&B, Covid) Anterior Nasal Swab   CT ANGIO HEAD NECK W WO CM   Ethanol   Protime-INR   APTT   CBC   Differential   Comprehensive metabolic panel   Urine rapid drug screen (hosp performed)   Urinalysis, Routine w reflex microscopic -Urine, Clean Catch   Diet NPO time specified   Vital signs   Cardiac Monitoring   NIH stroke score   Swallow screen   Initiate Carrier Fluid Protocol   If O2 sat If O2 Sat < 94%, administer O2 at 2 liters/minute via nasal cannula.   Consult to neurology   Consult to hospitalist   Pulse oximetry, continuous    I-stat chem 8, ED   ED EKG   EKG 12-Lead   Saline lock IV     OTHER Significant initial  Findings:  labs showing:    Recent Labs  Lab 11/23/22 2020  NA 137  K 4.3  CO2 26  GLUCOSE 100*  BUN 10  CREATININE 0.71  CALCIUM 8.7*    Cr   stable,    Lab Results  Component Value Date   CREATININE 0.71 11/23/2022   CREATININE 0.87 10/05/2019   CREATININE 1.00 10/19/2018    Recent Labs  Lab 11/23/22 2020  AST 20  ALT 22  ALKPHOS 83  BILITOT 0.4  PROT 7.3  ALBUMIN 4.0   Lab Results  Component Value Date   CALCIUM 8.7 (L) 11/23/2022    Plt: Lab Results  Component Value Date   PLT 172 11/23/2022      COVID-19 Labs  No results for input(s): "DDIMER", "FERRITIN", "LDH", "CRP" in the last 72 hours.  Lab Results  Component Value Date   [place] NEGATIVE 11/23/2022    Recent Labs  Lab 11/23/22 2020  WBC 7.5  NEUTROABS 4.3  HGB 12.3*  HCT 39.0  MCV 91.3  PLT 172    HG/HCT * stable,  Down *Up from baseline see below    Component  Value Date/Time   HGB 12.3 (L) 11/23/2022 2020   HCT 39.0 11/23/2022 2020   MCV 91.3 11/23/2022 2020      No results for input(s): "LIPASE", "AMYLASE" in the last 168 hours. No results for input(s): "AMMONIA" in the last 168 hours.    Cardiac Panel (last 3 results) No results for input(s): "CKTOTAL", "CKMB", "TROPONINI", "RELINDX" in the last 72 hours.  .car BNP (last 3 results) No results for input(s): "BNP" in the last 8760 hours.    DM  labs:  HbA1C: No results for input(s): "HGBA1C" in the last 8760 hours.     CBG (last 3)  No results for input(s): "GLUCAP" in the last 72 hours.        Cultures: No results found for: "SDES", "SPECREQUEST", "CULT", "REPTSTATUS"   Radiological Exams on Admission: CT ANGIO HEAD NECK W WO CM  Result Date: 11/23/2022 CLINICAL DATA:  Neuro deficit, acute, stroke suspected. Left upper extremity numbness and weakness. History of stroke two weeks ago. EXAM: CT ANGIOGRAPHY  HEAD AND NECK TECHNIQUE: Multidetector CT imaging of the head and neck was performed using the standard protocol during bolus administration of intravenous contrast. Multiplanar CT image reconstructions and MIPs were obtained to evaluate the vascular anatomy. Carotid stenosis measurements (when applicable) are obtained utilizing NASCET criteria, using the distal internal carotid diameter as the denominator. RADIATION DOSE REDUCTION: This exam was performed according to the departmental dose-optimization program which includes automated exposure control, adjustment of the mA and/or kV according to patient size and/or use of iterative reconstruction technique. CONTRAST:  24m OMNIPAQUE IOHEXOL 350 MG/ML SOLN COMPARISON:  Head CT 10/05/2019. No more recent imaging. Older studies as distant as 02/03/2017. FINDINGS: CT HEAD FINDINGS Brain: No focal abnormality seen affecting the brainstem or cerebellum. Cerebral hemispheres show mild chronic small-vessel change of the white matter. No cortical or large vessel territory infarction. No mass lesion, hemorrhage, hydrocephalus or extra-axial collection. Vascular: There is atherosclerotic calcification of the major vessels at the base of the brain. Skull: No calvarial abnormality. Sinuses/Orbits: Previous left maxillary resection with regional soft tissue thickening and chronic bony changes. Soft tissue along the margins of the previous resection appear more prominent than on older studies, such as 02/03/2017. It may be prudent for subsequent ENT referral for re-evaluation. Other: None Review of the MIP images confirms the above findings CTA NECK FINDINGS Aortic arch: Aortic atherosclerosis. Branching pattern is normal, with an early origin of the left vertebral artery from either the arch or the proximal left subclavian artery. Right carotid system: Common carotid artery widely patent to the bifurcation. Calcified plaque at the carotid bifurcation but no stenosis. Cervical  ICA is normal. Left carotid system: Common carotid artery widely patent to the bifurcation. Calcified plaque at the carotid bifurcation and ICA bulb but no stenosis. Cervical ICA widely patent beyond that. Vertebral arteries: Both vertebral artery origins are patent. As noted above, the left vertebral artery either arises from the arch or the very proximal left subclavian artery. Both vessels patent beyond that through the cervical region to the foramen magnum. Skeleton: Chronic cervical spondylosis. Other neck: No mass or lymphadenopathy. Upper chest: Lung apices show emphysema and scarring. Review of the MIP images confirms the above findings CTA HEAD FINDINGS Anterior circulation: Both internal carotid arteries are patent through the skull base and siphon regions. There is siphon atherosclerotic calcification but no stenosis greater than 30%. Large posterior communicating artery/fetal origin of the right PCA. The anterior and middle cerebral vessels are patent.  No large vessel occlusion or proximal stenosis. No aneurysm or vascular malformation. Posterior circulation: Both vertebral arteries are widely patent to the basilar artery. No basilar stenosis. Posterior circulation branch vessels are patent and normal. As noted above, there is fetal origin of the left PCA Venous sinuses: Patent and normal. Anatomic variants: None significant. Review of the MIP images confirms the above findings IMPRESSION: 1. No acute head CT finding. Mild chronic small-vessel ischemic change of the white matter. 2. No intracranial large vessel occlusion or proximal stenosis. 3. Aortic atherosclerosis. 4. Atherosclerotic change at both carotid bifurcations but no stenosis. 5. Previous left maxillary resection with regional soft tissue thickening and chronic bony changes. Soft tissue along the margins of the previous resection appears more prominent than on older studies, such as 02/03/2017. It may be prudent for subsequent ENT referral  for re-evaluation, particularly if the previous pathology was that of inverting papilloma. Aortic Atherosclerosis (ICD10-I70.0). Electronically Signed   By: Nelson Chimes M.D.   On: 11/23/2022 21:50   _______________________________________________________________________________________________________ Latest  Blood pressure 119/69, pulse 65, temperature 97.7 F (36.5 C), resp. rate 15, height 5' 6"$  (1.676 m), weight 81.6 kg, SpO2 94 %.   Vitals  labs and radiology finding personally reviewed  Review of Systems:    Pertinent positives include: ***  Constitutional:  No weight loss, night sweats, Fevers, chills, fatigue, weight loss  HEENT:  No headaches, Difficulty swallowing,Tooth/dental problems,Sore throat,  No sneezing, itching, ear ache, nasal congestion, post nasal drip,  Cardio-vascular:  No chest pain, Orthopnea, PND, anasarca, dizziness, palpitations.no Bilateral lower extremity swelling  GI:  No heartburn, indigestion, abdominal pain, nausea, vomiting, diarrhea, change in bowel habits, loss of appetite, melena, blood in stool, hematemesis Resp:  no shortness of breath at rest. No dyspnea on exertion, No excess mucus, no productive cough, No non-productive cough, No coughing up of blood.No change in color of mucus.No wheezing. Skin:  no rash or lesions. No jaundice GU:  no dysuria, change in color of urine, no urgency or frequency. No straining to urinate.  No flank pain.  Musculoskeletal:  No joint pain or no joint swelling. No decreased range of motion. No back pain.  Psych:  No change in mood or affect. No depression or anxiety. No memory loss.  Neuro: no localizing neurological complaints, no tingling, no weakness, no double vision, no gait abnormality, no slurred speech, no confusion  All systems reviewed and apart from [place] all are negative _______________________________________________________________________________________________ Past Medical History:   Past  Medical History:  Diagnosis Date   Alcohol abuse    Bipolar 1 disorder ([place])    Hepatitis    tx 80's   Hypertension    Peripheral vascular disease ([place])    ?"had blood clots in arms- no tx?"   Pneumonia    hx   Seizures ([facility])    last time past 5 months   Stomach ulcer    Stroke ([place])    weakness rt hand      Past Surgical History:  Procedure Laterality Date   BRAIN TUMOR EXCISION  90   nasal approach   HERNIA REPAIR Left    SINUS ENDO WITH FUSION Left 04/17/2016   Procedure: LEFT MEDIAL MAXILLECTOMY VIA ENDOSCOPIC AND CALDWELL-LUC APPROACH WITH FUSION;  Surgeon: Melida Quitter, MD;  Location: [facility] OR;  Service: ENT;  Laterality: Left;    Social History:  Ambulatory *** independently cane, walker  wheelchair bound, bed bound     reports that he has been smoking cigarettes.  He has a 50.00 pack-year smoking history. He uses smokeless tobacco. He reports current alcohol use of about 12.0 standard drinks of alcohol per week. He reports that he does not currently use drugs after having used the following drugs: Marijuana.     Family History: *** Family History  Problem Relation Age of Onset   Alcoholism Mother    Alcoholism Father    ______________________________________________________________________________________________ Allergies: Allergies  Allergen Reactions   Borax Anaphylaxis and Other (See Comments)    SODIUM BORATE     Prior to Admission medications   Medication Sig Start Date End Date Taking? Authorizing Provider  acetaminophen (TYLENOL) 500 MG tablet Take 1 tablet (500 mg total) by mouth every 6 (six) hours as needed. 10/19/18   Domenic Moras, PA-C  benzonatate (TESSALON) 100 MG capsule Take 1 capsule (100 mg total) by mouth 3 (three) times daily as needed for cough. 10/19/18   Domenic Moras, PA-C  oseltamivir (TAMIFLU) 75 MG capsule Take 1 capsule (75 mg total) by mouth every 12 (twelve) hours. 10/19/18   Domenic Moras, PA-C  penicillin v potassium (VEETID) 500  MG tablet Take 1 tablet (500 mg total) by mouth 3 (three) times daily. 06/02/18   Charlann Lange, PA-C    ___________________________________________________________________________________________________ Physical Exam:    11/23/2022    8:52 PM 11/23/2022    8:45 PM 11/23/2022    8:30 PM  Vitals with BMI  Systolic [id number]  Diastolic 69 69 73  Pulse 65       1. General:  in No ***Acute distress***increased work of breathing ***complaining of severe pain****agitated * Chronically ill *well *cachectic *toxic acutely ill -appearing 2. Psychological: Alert and *** Oriented 3. Head/ENT:   Moist *** Dry Mucous Membranes                          Head Non traumatic, neck supple                          Normal *** Poor Dentition 4. SKIN: normal *** decreased Skin turgor,  Skin clean Dry and intact no rash 5. Heart: Regular rate and rhythm no*** Murmur, no Rub or gallop 6. Lungs: ***Clear to auscultation bilaterally, no wheezes or crackles   7. Abdomen: Soft, ***non-tender, Non distended *** obese ***bowel sounds present 8. Lower extremities: no clubbing, cyanosis, no ***edema 9. Neurologically Grossly intact, moving all 4 extremities equally *** strength 5 out of 5 in all 4 extremities cranial nerves II through XII intact 10. MSK: Normal range of motion    Chart has been reviewed  ______________________________________________________________________________________________  Assessment/Plan  ***  Admitted for *** There are no diagnoses linked to this encounter.   Present on Admission: **None**     No problem-specific Assessment & Plan notes found for this encounter.    Other plan as per orders.  DVT prophylaxis:  SCD *** Lovenox       Code Status:    Code Status: Prior FULL CODE *** DNR/DNI ***comfort care as per patient ***family  I had personally discussed CODE STATUS with patient and family* I had spent *min discussing goals of care and CODE STATUS    Family  Communication:   Family not at  Bedside  plan of care was discussed on the phone with *** Son, Daughter, Wife, Husband, Sister, Brother , father, mother  Disposition Plan:   *** likely will need placement for rehabilitation  Back to current facility when stable                            To home once workup is complete and patient is stable  ***Following barriers for discharge:                            Electrolytes corrected                               Anemia corrected                             Pain controlled with PO medications                               Afebrile, white count improving able to transition to PO antibiotics                             Will need to be able to tolerate PO                            Will likely need home health, home O2, set up                           Will need consultants to evaluate patient prior to discharge  ****EXPECT DC tomorrow                    ***Would benefit from PT/OT eval prior to DC  Ordered                   Swallow eval - SLP ordered                   Diabetes care coordinator                   Transition of care consulted                   Nutrition    consulted                  Wound care  consulted                   Palliative care    consulted                   Behavioral health  consulted                    Consults called: ***    Admission status:  ED Disposition     None        Obs***  ***  inpatient     I Expect 2 midnight stay secondary to severity of patient's current illness need for inpatient interventions justified by the following: ***hemodynamic instability despite optimal treatment (tachycardia *hypotension * tachypnea *hypoxia, hypercapnia) * Severe lab/radiological/exam abnormalities including:     and extensive comorbidities including: *substance abuse  *Chronic pain *DM2  * CHF * CAD  * COPD/asthma *Morbid Obesity * CKD *dementia *liver disease *history of  stroke with residual  deficits *  malignancy, * sickle cell disease  History of amputation Chronic anticoagulation  That are currently affecting medical management.   I expect  patient to be hospitalized for 2 midnights requiring inpatient medical care.  Patient is at high risk for adverse outcome (such as loss of life or disability) if not treated.  Indication for inpatient stay as follows:  Severe change from baseline regarding mental status Hemodynamic instability despite maximal medical therapy,  ongoing suicidal ideations,  severe pain requiring acute inpatient management,  inability to maintain oral hydration   persistent chest pain despite medical management Need for operative/procedural  intervention New or worsening hypoxia   Need for IV antibiotics, IV fluids, IV rate controling medications, IV antihypertensives, IV pain medications, IV anticoagulation, need for biPAP    Level of care   *** tele  For 12H 24H     medical floor       progressive tele indefinitely please discontinue once patient no longer qualifies COVID-19 Labs    Lab Results  Component Value Date   [place] NEGATIVE 11/23/2022     Precautions: admitted as *** Covid Negative  ***asymptomatic screening protocol****PUI *** covid positive No active isolations ***If Covid PCR is negative  - please DC precautions - would need additional investigation given very high risk for false native test result    Critical***  Patient is critically ill due to  hemodynamic instability * respiratory failure *severe sepsis* ongoing chest pain*  They are at high risk for life/limb threatening clinical deterioration requiring frequent reassessment and modifications of care.  Services provided include examination of the patient, review of relevant ancillary tests, prescription of lifesaving therapies, review of medications and prophylactic therapy.  Total critical care time excluding separately billable procedures:  60*  Minutes.    Jeremy Ditullio 11/23/2022, 10:26 PM ***  [facility]     after 2 AM please page floor coverage PA If 7AM-7PM, please contact the day team taking care of the patient using [email or web address]   Patient was evaluated in the context of the global COVID-19 pandemic, which necessitated consideration that the patient might be at risk for infection with the SARS-CoV-2 virus that causes COVID-19. Institutional protocols and algorithms that pertain to the evaluation of patients at risk for COVID-19 are in a state of rapid change based on information released by regulatory bodies including the CDC and federal and state organizations. These policies and algorithms were followed during the patient's care.

## 2022-11-23 NOTE — ED Triage Notes (Addendum)
Patient had a stroke 2 weeks ago. He said his primary care doctor sent him to the ED for a MRI. Complaining of left arm pain, numbness and tingling ever since the stroke. Takes a blood thinner but can't remember the name.

## 2022-11-23 NOTE — ED Provider Notes (Signed)
[facility] Provider Note   CSN: [id number] Arrival date & time: 11/23/22  1733     History  Chief Complaint  Patient presents with   Arm Pain    [name] is a 62 y.o. male with h/o seizures, HTN, alcohol use disorder, MDD, L maxillary sinus mass s/p resection presents with LUE weakness/numbness/tingling.   Patient reports that he had a stroke 2 weeks ago.  He states that he knows he had a stroke because he had electric-like sensation of pain of his right arm down his left arm and since then he has had weakness and numbness in his left arm and hand as well as neuropathic like pain.  He states he was not evaluated for the stroke but knows he had 1.  He denies any trouble speaking, confusion, trouble swallowing.  He has facial numbness on the left at baseline due to a resected maxillary sinus mass.  He was seen by his primary care doctor today who sent him to the emergency department.  He states he has never had a stroke before until now.  He denies any falls, lower extremity symptoms such as asymmetric weakness or numbness tingling, trouble urinating, changes in bowel habits, fever/chills, flulike symptoms.  He denies any headache or neck pain at this time.   Arm Pain       Home Medications Prior to Admission medications   Medication Sig Start Date End Date Taking? Authorizing Provider  acetaminophen (TYLENOL) 500 MG tablet Take 1 tablet (500 mg total) by mouth every 6 (six) hours as needed. 10/19/18   Domenic Moras, PA-C  benzonatate (TESSALON) 100 MG capsule Take 1 capsule (100 mg total) by mouth 3 (three) times daily as needed for cough. 10/19/18   Domenic Moras, PA-C  oseltamivir (TAMIFLU) 75 MG capsule Take 1 capsule (75 mg total) by mouth every 12 (twelve) hours. 10/19/18   Domenic Moras, PA-C  penicillin v potassium (VEETID) 500 MG tablet Take 1 tablet (500 mg total) by mouth 3 (three) times daily. 06/02/18   Charlann Lange, PA-C       Allergies    Borax    Review of Systems   Review of Systems Review of systems Negative for f/c, headache.  A 10 point review of systems was performed and is negative unless otherwise reported in HPI.  Physical Exam Updated Vital Signs BP 119/69   Pulse 65   Temp 97.7 F (36.5 C)   Resp 15   Ht 5' 6"$  (1.676 m)   Wt 81.6 kg   SpO2 94%   BMI 29.05 kg/m  Physical Exam General: Normal appearing nale, lying in bed.  HEENT: PERRLA, Sclera anicteric, MMM, trachea midline.  Cardiology: RRR, no murmurs/rubs/gallops. BL radial and DP pulses equal bilaterally.  Resp: Normal respiratory rate and effort. CTAB, no wheezes, rhonchi, crackles.  Abd: Soft, non-tender, non-distended. No rebound tenderness or guarding.  GU: Deferred. MSK: No peripheral edema or signs of trauma. Extremities without deformity or TTP. No cyanosis or clubbing. Skin: warm, dry. No rashes or lesions. Back: Midline C-spine T-spine or L-spine tenderness palpation.  No signs of trauma.  No CVA tenderness Neuro: A&Ox4, CNs II-XII grossly intact. 5/5 strength in RUE, LLE, RLE. 4/5 strength in LUE and grip strength. Decreased sensation to light touch on LUE. Intact coordination bilaterally. Psych: Normal mood and affect.   1a  Level of consciousness: 0=alert; keenly responsive  1b. LOC questions:  0=Performs both tasks correctly  1c. LOC commands: 0=Performs both tasks correctly  2.  Best Gaze: 0=normal  3.  Visual: 0=No visual loss  4. Facial Palsy: 0=Normal symmetric movement  5a.  Motor left arm: 1=Drift, limb holds 90 (or 45) degrees but drifts down before full 10 seconds: does not hit bed  5b.  Motor right arm: 0=No drift, limb holds 90 (or 45) degrees for full 10 seconds  6a. motor left leg: 0=No drift, limb holds 90 (or 45) degrees for full 10 seconds  6b  Motor right leg:  0=No drift, limb holds 90 (or 45) degrees for full 10 seconds  7. Limb Ataxia: 0=Absent  8.  Sensory: 1=Mild to moderate sensory loss;  patient feels pinprick is less sharp or is dull on the affected side; there is a loss of superficial pain with pinprick but patient is aware He is being touched  9. Best Language:  0=No aphasia, normal  10. Dysarthria: 0=Normal  11. Extinction and Inattention: 0=No abnormality   Total:   2        ED Results / Procedures / Treatments   Labs (all labs ordered are listed, but only abnormal results are displayed) Labs Reviewed  CBC - Abnormal; Notable for the following components:      Result Value   Hemoglobin 12.3 (*)    All other components within normal limits  COMPREHENSIVE METABOLIC PANEL - Abnormal; Notable for the following components:   Glucose, Bld 100 (*)    Calcium 8.7 (*)    All other components within normal limits  RETICULOCYTES - Abnormal; Notable for the following components:   Immature Retic Fract 21.5 (*)    All other components within normal limits  RESP PANEL BY RT-PCR (RSV, FLU A&B, COVID)  RVPGX2  ETHANOL  PROTIME-INR  APTT  DIFFERENTIAL  RAPID URINE DRUG SCREEN, HOSP PERFORMED  URINALYSIS, ROUTINE W REFLEX MICROSCOPIC  CK  MAGNESIUM  PHOSPHORUS  AMMONIA  PREALBUMIN  VITAMIN B12  FOLATE  IRON AND TIBC  FERRITIN  SEDIMENTATION RATE  C-REACTIVE PROTEIN  TSH  PHENYTOIN LEVEL, TOTAL  I-STAT CHEM 8, ED    EKG EKG Interpretation  Date/Time:  Monday November 23 2022 20:16:01 EST Ventricular Rate:  66 PR Interval:  130 QRS Duration: 87 QT Interval:  454 QTC Calculation: 476 R Axis:   37 Text Interpretation: Sinus rhythm Confirmed by Cindee Lame [phone]) on 11/23/2022 10:13:19 PM  Radiology CT ANGIO HEAD NECK W WO CM  Result Date: 11/23/2022 CLINICAL DATA:  Neuro deficit, acute, stroke suspected. Left upper extremity numbness and weakness. History of stroke two weeks ago. EXAM: CT ANGIOGRAPHY HEAD AND NECK TECHNIQUE: Multidetector CT imaging of the head and neck was performed using the standard protocol during bolus administration of  intravenous contrast. Multiplanar CT image reconstructions and MIPs were obtained to evaluate the vascular anatomy. Carotid stenosis measurements (when applicable) are obtained utilizing NASCET criteria, using the distal internal carotid diameter as the denominator. RADIATION DOSE REDUCTION: This exam was performed according to the departmental dose-optimization program which includes automated exposure control, adjustment of the mA and/or kV according to patient size and/or use of iterative reconstruction technique. CONTRAST:  50m OMNIPAQUE IOHEXOL 350 MG/ML SOLN COMPARISON:  Head CT 10/05/2019. No more recent imaging. Older studies as distant as 02/03/2017. FINDINGS: CT HEAD FINDINGS Brain: No focal abnormality seen affecting the brainstem or cerebellum. Cerebral hemispheres show mild chronic small-vessel change of the white matter. No cortical or large vessel territory infarction. No mass lesion, hemorrhage, hydrocephalus or  extra-axial collection. Vascular: There is atherosclerotic calcification of the major vessels at the base of the brain. Skull: No calvarial abnormality. Sinuses/Orbits: Previous left maxillary resection with regional soft tissue thickening and chronic bony changes. Soft tissue along the margins of the previous resection appear more prominent than on older studies, such as 02/03/2017. It may be prudent for subsequent ENT referral for re-evaluation. Other: None Review of the MIP images confirms the above findings CTA NECK FINDINGS Aortic arch: Aortic atherosclerosis. Branching pattern is normal, with an early origin of the left vertebral artery from either the arch or the proximal left subclavian artery. Right carotid system: Common carotid artery widely patent to the bifurcation. Calcified plaque at the carotid bifurcation but no stenosis. Cervical ICA is normal. Left carotid system: Common carotid artery widely patent to the bifurcation. Calcified plaque at the carotid bifurcation and ICA  bulb but no stenosis. Cervical ICA widely patent beyond that. Vertebral arteries: Both vertebral artery origins are patent. As noted above, the left vertebral artery either arises from the arch or the very proximal left subclavian artery. Both vessels patent beyond that through the cervical region to the foramen magnum. Skeleton: Chronic cervical spondylosis. Other neck: No mass or lymphadenopathy. Upper chest: Lung apices show emphysema and scarring. Review of the MIP images confirms the above findings CTA HEAD FINDINGS Anterior circulation: Both internal carotid arteries are patent through the skull base and siphon regions. There is siphon atherosclerotic calcification but no stenosis greater than 30%. Large posterior communicating artery/fetal origin of the right PCA. The anterior and middle cerebral vessels are patent. No large vessel occlusion or proximal stenosis. No aneurysm or vascular malformation. Posterior circulation: Both vertebral arteries are widely patent to the basilar artery. No basilar stenosis. Posterior circulation branch vessels are patent and normal. As noted above, there is fetal origin of the left PCA Venous sinuses: Patent and normal. Anatomic variants: None significant. Review of the MIP images confirms the above findings IMPRESSION: 1. No acute head CT finding. Mild chronic small-vessel ischemic change of the white matter. 2. No intracranial large vessel occlusion or proximal stenosis. 3. Aortic atherosclerosis. 4. Atherosclerotic change at both carotid bifurcations but no stenosis. 5. Previous left maxillary resection with regional soft tissue thickening and chronic bony changes. Soft tissue along the margins of the previous resection appears more prominent than on older studies, such as 02/03/2017. It may be prudent for subsequent ENT referral for re-evaluation, particularly if the previous pathology was that of inverting papilloma. Aortic Atherosclerosis (ICD10-I70.0). Electronically  Signed   By: Nelson Chimes M.D.   On: 11/23/2022 21:50    Procedures Procedures    Medications Ordered in ED Medications  iohexol (OMNIPAQUE) 350 MG/ML injection 75 mL (75 mLs Intravenous Contrast Given 11/23/22 2128)    ED Course/ Medical Decision Making/ A&P                          Medical Decision Making Amount and/or Complexity of Data Reviewed Labs: ordered. Radiology: ordered. Decision-making details documented in ED Course.  Risk Prescription drug management.    This patient presents to the ED for concern of LUE weakness/numbness/tingling; this involves an extensive number of treatment options, and is a complaint that carries with it a high risk of complications and morbidity.  I considered the following differential and admission for this acute, potentially life threatening condition. However patien t is well appearing at this time  MDM:    Given patient's neurological  symptoms, stroke is the most concerning etiology of these acute symptoms. The neuro exam is significant for weakness and numbness/tingling to LUE. NIHSS 2.  Also consider possible cervical spine radiculopathy given report of possible neuropathic pain the patient reports no trauma or neck pain.  Also consider other causes such as electrolyte derangements, hypo-/hyperglycemia, illicit drug use or alcohol intake.  Patient has no back or neck pain and no fevers to suggest transverse myelitis or spinal epidural abscess.  Will further evaluate with CT head and CTA head and neck.  Patient will likely need MRI brain and C-spine and will consult with neurology and hospitalist for admission.   LKN: 2 weeks ago Glucose: 100 AC: No BP: 160/89    Clinical Course as of 11/23/22 2326  Mon Nov 23, 2022  2107 Labs including CMP, CBC, EtOH, PT INR, PTT, unremarkable [HN]  2205 CT ANGIO HEAD NECK W WO CM 1. No acute head CT finding. Mild chronic small-vessel ischemic change of the white matter. 2. No intracranial large  vessel occlusion or proximal stenosis. 3. Aortic atherosclerosis. 4. Atherosclerotic change at both carotid bifurcations but no stenosis. 5. Previous left maxillary resection with regional soft tissue thickening and chronic bony changes. Soft tissue along the margins of the previous resection appears more prominent than on older studies, such as 02/03/2017. It may be prudent for subsequent ENT referral for re-evaluation, particularly if the previous pathology was that of inverting papilloma.   [HN]  2205 No LVO on CTA. Consulted to neurology. [HN]  2323 Patient decided to [facility]. He states he needs to be at work in the morning. I discussed extensively with the patient the risks of leaving including permanent morbidity and mortality as a result of possible stroke and continued further strokes.  Patient reports understanding and is excepted the risk.  He has displayed competence.  Patient will be leaving [place], discharge instructions given and discussed, return precautions discussed. [HN]    Clinical Course User Index [HN] Audley Hose, MD    Labs: I Ordered, and personally interpreted labs.  The pertinent results include:  those listed above  Imaging Studies ordered: I ordered imaging studies including CTH, CTA H&N I independently visualized and interpreted imaging. I agree with the radiologist interpretation  Additional history obtained from chart review.   Cardiac Monitoring: The patient was maintained on a cardiac monitor.  I personally viewed and interpreted the cardiac monitored which showed an underlying rhythm of: NSR  Reevaluation: After the interventions noted above, I reevaluated the patient and found that they have :stayed the same  Social Determinants of Health: Patient lives independently   Disposition: [place]  Co morbidities that complicate the patient evaluation  Past Medical History:  Diagnosis Date   Alcohol abuse     Bipolar 1 disorder ([place])    Hepatitis    tx 80's   Hypertension    Peripheral vascular disease ([place])    ?"had blood clots in arms- no tx?"   Pneumonia    hx   Seizures ([facility])    last time past 5 months   Stomach ulcer    Stroke ([facility]CC)    weakness rt hand     Medicines Meds ordered this encounter  Medications   iohexol (OMNIPAQUE) 350 MG/ML injection 75 mL    I have reviewed the patients home medicines and have made adjustments as needed  Problem List / ED Course: Problem List Items Addressed This Visit   None Visit  Diagnoses     Numbness and tingling in left arm    -  Primary   Left arm weakness                       This note was created using dictation software, which may contain spelling or grammatical errors.    Audley Hose, MD 11/23/22 [phone]

## 2022-11-23 NOTE — Subjective & Objective (Signed)
Pt had a CVA 2 wks ago and his PCP sent him to ER now to get MRI   Reports left arm pain/tingling since CVA Known history of seizures hypertension alcohol abuse Patient states that he thinks he had a stroke because he had electric shocklike pain down his left arm and weakness and numbness and neuropathic leg pain no trouble speaking no trouble swallowing he has baseline left-sided numbness secondary to 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 Denies any fevers chills no ataxia no trouble walking no trouble urinating

## 2022-11-23 NOTE — Discharge Instructions (Signed)
Thank you for coming to [facility] Emergency Department. You were seen for left upper extremity weakness and numbness that could represent a stroke. We did an exam, labs, and imaging, and these showed possible stroke or cervical spine abnormality. We recommended you stay and be further evaluated with MRI and other imaging/workup, but you elected to leave [place].  You have accepted the risk of significant and permanent disability or death as a result of leaving.  Please return to the ED at your earliest convenience.

## 2022-11-23 NOTE — ED Notes (Signed)
Pt left AMA. IV in trash. Catheter intact.

## 2022-11-24 LAB — IRON AND TIBC
Iron: 45 ug/dL (ref 45–182)
Saturation Ratios: 11 % — ABNORMAL LOW (ref 17.9–39.5)
TIBC: 393 ug/dL (ref 250–450)
UIBC: 348 ug/dL

## 2022-11-24 LAB — VITAMIN B12: Vitamin B-12: 243 pg/mL (ref 180–914)

## 2022-11-24 LAB — SEDIMENTATION RATE: Sed Rate: 11 mm/hr (ref 0–16)

## 2022-11-24 LAB — C-REACTIVE PROTEIN: CRP: 1.1 mg/dL — ABNORMAL HIGH (ref <1.0)

## 2022-11-24 LAB — FERRITIN: Ferritin: 59 ng/mL (ref 24–336)

## 2023-03-12 ENCOUNTER — Ambulatory Visit: Payer: Medicaid Other | Attending: Internal Medicine | Admitting: Internal Medicine

## 2023-03-12 ENCOUNTER — Encounter: Payer: Self-pay | Admitting: Internal Medicine

## 2023-03-12 VITALS — BP 135/83 | HR 56 | Ht 65.0 in | Wt 150.6 lb

## 2023-03-12 DIAGNOSIS — G459 Transient cerebral ischemic attack, unspecified: Secondary | ICD-10-CM

## 2023-03-12 NOTE — Patient Instructions (Signed)
Medication Instructions:  Your physician recommends that you continue on your current medications as directed. Please refer to the Current Medication list given to you today.   *If you need a refill on your cardiac medications before your next appointment, please call your pharmacy*   Lab Work:  If you have labs (blood work) drawn today and your tests are completely normal, you will receive your results only by: MyChart Message (if you have MyChart) OR A paper copy in the mail If you have any lab test that is abnormal or we need to change your treatment, we will call you to review the results.   Testing/Procedures: Your physician has requested that you have an echocardiogram. Echocardiography is a painless test that uses sound waves to create images of your heart. It provides your doctor with information about the size and shape of your heart and how well your heart's chambers and valves are working. This procedure takes approximately one hour. There are no restrictions for this procedure. Please do NOT wear cologne,aftershave, or lotions (deodorant is allowed). Please arrive 15 minutes prior to your appointment time.  [employer] Cardiac Event Monitor Instructions  Your physician has requested you wear your cardiac event monitor for __30___ days, (1-30). [employer] may call or text to confirm a shipping address. The monitor will be sent to a land address via UPS. [employer] will not ship a monitor to a PO BOX. It typically takes 3-5 days to receive your monitor after it has been enrolled. [employer] will assist with USPS tracking if your package is delayed. The telephone number for [employer] is [phone]. Once you have received your monitor, please review the enclosed instructions. Instruction tutorials can also be viewed under help and settings on the enclosed cell phone. Your monitor has already been registered assigning a specific monitor serial # to you.  Billing and Self Pay  Discount Information  [employer] has been provided the insurance information we had on file for you.  If your insurance has been updated, please call [employer] at [phone] to provide them with your updated insurance information.   [employer] offers a discounted Self Pay option for patients who have insurance that does not cover their cardiac event monitor or patients without insurance.  The discounted cost of a Self Pay Cardiac Event Monitor would be $225.00 , if the patient contacts [employer] at [phone] within 7 days of applying the monitor to make payment arrangements.  If the patient does not contact [employer] within 7 days of applying the monitor, the cost of the cardiac event monitor will be $350.00.  Applying the monitor  Remove cell phone from case and turn it on. The cell phone works as IT consultant and needs to be within [employer] of you at all times. The cell phone will need to be charged on a daily basis. We recommend you plug the cell phone into the enclosed charger at your bedside table every night.  Monitor batteries: You will receive two monitor batteries labelled #1 and #2. These are your recorders. Plug battery #2 onto the second connection on the enclosed charger. Keep one battery on the charger at all times. This will keep the monitor battery deactivated. It will also keep it fully charged for when you need to switch your monitor batteries. A small light will be blinking on the battery emblem when it is charging. The light on the battery emblem will remain on when the battery is fully charged.  Open package of a Monitor strip. Insert  battery #1 into black hood on strip and gently squeeze monitor battery onto connection as indicated in instruction booklet. Set aside while preparing skin.  Choose location for your strip, vertical or horizontal, as indicated in the instruction booklet. Shave to remove all hair from location. There cannot be any lotions, oils,  powders, or colognes on skin where monitor is to be applied. Wipe skin clean with enclosed Saline wipe. Dry skin completely.  Peel paper labeled #1 off the back of the Monitor strip exposing the adhesive. Place the monitor on the chest in the vertical or horizontal position shown in the instruction booklet. One arrow on the monitor strip must be pointing upward. Carefully remove paper labeled #2, attaching remainder of strip to your skin. Try not to create any folds or wrinkles in the strip as you apply it.  Firmly press and release the circle in the center of the monitor battery. You will hear a small beep. This is turning the monitor battery on. The heart emblem on the monitor battery will light up every 5 seconds if the monitor battery in turned on and connected to the patient securely. Do not push and hold the circle down as this turns the monitor battery off. The cell phone will locate the monitor battery. A screen will appear on the cell phone checking the connection of your monitor strip. This may read poor connection initially but change to good connection within the next minute. Once your monitor accepts the connection you will hear a series of 3 beeps followed by a climbing crescendo of beeps. A screen will appear on the cell phone showing the two monitor strip placement options. Touch the picture that demonstrates where you applied the monitor strip.  Your monitor strip and battery are waterproof. You are able to shower, bathe, or swim with the monitor on. They just ask you do not submerge deeper than 3 feet underwater. We recommend removing the monitor if you are swimming in a lake, river, or ocean.  Your monitor battery will need to be switched to a fully charged monitor battery approximately once a week. The cell phone will alert you of an action which needs to be made.  On the cell phone, tap for details to reveal connection status, monitor battery status, and cell phone  battery status. The green dots indicates your monitor is in good status. A red dot indicates there is something that needs your attention.  To record a symptom, click the circle on the monitor battery. In 30-60 seconds a list of symptoms will appear on the cell phone. Select your symptom and tap save. Your monitor will record a sustained or significant arrhythmia regardless of you clicking the button. Some patients do not feel the heart rhythm irregularities. [employer] will notify us of any serious or critical events.  Refer to instruction booklet for instructions on switching batteries, changing strips, the Do not disturb or Pause features, or any additional questions.  Call [employer] at [phone], to confirm your monitor is transmitting and record your baseline. They will answer any questions you may have regarding the monitor instructions at that time.  Returning the monitor to [employer]  Place all equipment back into blue box. Peel off strip of paper to expose adhesive and close box securely. There is a prepaid UPS shipping label on this box. Drop in a UPS drop box, or at a UPS facility like [employer]. You may also contact [employer] to arrange UPS to pick up monitor package at your  home.    Follow-Up: At [facility], you and your health needs are our priority.  As part of our continuing mission to provide you with exceptional heart care, we have created designated Provider Care Teams.  These Care Teams include your primary Cardiologist (physician) and Advanced Practice Providers (APPs -  Physician Assistants and Nurse Practitioners) who all work together to provide you with the care you need, when you need it.  We recommend signing up for the patient portal called "MyChart".  Sign up information is provided on this After Visit Summary.  MyChart is used to connect with patients for Virtual Visits (Telemedicine).  Patients are able to view lab/test results, encounter  notes, upcoming appointments, etc.  Non-urgent messages can be sent to your provider as well.   To learn more about what you can do with MyChart, go to [email or web address].    Your next appointment:   As needed   Provider:   Maisie Fus, MD

## 2023-03-12 NOTE — Progress Notes (Signed)
Cardiology Office Note:    Date:  03/12/2023   ID:  [name], DOB 08-02-1961, MRN [id number]  PCP:  Dessa Phi, MD   [facility] Providers Cardiologist:  Maisie Fus, MD     Referring MD: Levonne Lapping, NP   No chief complaint on file. TIA  History of Present Illness:    [name] is a 62 y.o. male with a hx of bipolar, etoh, HTN, he had a referral placed to cardiology for "Other specified symptoms and signs involving the circulatory and respiratory systems " He went to the ED in February with stroke like symptoms. Did not have an MRI or echo. CT head showed no acute intracranial finding. Chronic small-vessel ischemic changes of the hemispheric white matter.  CT neck angio did not show significant stenosis. EKG was normal. No hx of arrhythmia. He is maintained on aspirin and a statin. He does general labor, he denies CP or SOB. He is a smoker.  No premature CAD. Brother had a device  Past Medical History:  Diagnosis Date   Alcohol abuse    Bipolar 1 disorder (HCC)    Hepatitis    tx 80's   Hypertension    Peripheral vascular disease (HCC)    ?"had blood clots in arms- no tx?"   Pneumonia    hx   Seizures (HCC)    last time past 5 months   Stomach ulcer    Stroke (HCC)    weakness rt hand    Past Surgical History:  Procedure Laterality Date   BRAIN TUMOR EXCISION  90   nasal approach   HERNIA REPAIR Left    SINUS ENDO WITH FUSION Left 04/17/2016   Procedure: LEFT MEDIAL MAXILLECTOMY VIA ENDOSCOPIC AND CALDWELL-LUC APPROACH WITH FUSION;  Surgeon: Christia Reading, MD;  Location: [facility] OR;  Service: ENT;  Laterality: Left;    Current Medications: Current Outpatient Medications on File Prior to Visit  Medication Sig Dispense Refill   acetaminophen (TYLENOL) 500 MG tablet Take 1 tablet (500 mg total) by mouth every 6 (six) hours as needed. 30 tablet 0   albuterol (PROVENTIL) (2.5 MG/3ML) 0.083% nebulizer solution Inhale 3 mL 3 times a day  by nebulization route for 90 days, for shortness of breath and wheezing.     albuterol (VENTOLIN HFA) 108 (90 Base) MCG/ACT inhaler 2 puffs q 4 hours for shortness of breath     allopurinol (ZYLOPRIM) 100 MG tablet Take 1 tablet by mouth daily.     ARIPiprazole (ABILIFY) 5 MG tablet Take 5 mg by mouth once.     Aspirin 81 MG CAPS Take 1 capsule every day by oral route for 90 days.     atorvastatin (LIPITOR) 40 MG tablet Take 1 tablet by mouth at bedtime.     cloNIDine (CATAPRES) 0.1 MG tablet Take 0.1 mg by mouth daily.     colchicine 0.6 MG tablet Take 1 tablet by mouth daily.     famotidine (PEPCID) 20 MG tablet Take 20 mg by mouth daily.     fluticasone (FLONASE) 50 MCG/ACT nasal spray Place 2 sprays into both nostrils daily.     HYDROcodone-acetaminophen (NORCO/VICODIN) 5-325 MG tablet Take 1-2 tablets by mouth.     lisinopril-hydrochlorothiazide (ZESTORETIC) 20-25 MG tablet Take 1 tablet by mouth daily.     sildenafil (VIAGRA) 100 MG tablet Take 1 tablet by mouth daily.     traMADol (ULTRAM) 50 MG tablet Take 1 tablet by mouth every 12 (twelve)  hours as needed.     No current facility-administered medications on file prior to visit.    Allergies:   Borax   Social History   Socioeconomic History   Marital status: Single    Spouse name: Not on file   Number of children: Not on file   Years of education: Not on file   Highest education level: Not on file  Occupational History   Not on file  Tobacco Use   Smoking status: Some Days    Packs/day: 2.00    Years: 25.00    Additional pack years: 0.00    Total pack years: 50.00    Types: Cigarettes   Smokeless tobacco: Current  Substance and Sexual Activity   Alcohol use: Yes    Alcohol/week: 12.0 standard drinks of alcohol    Types: 12 Cans of beer per week   Drug use: Not Currently    Types: Marijuana    Comment: occ Last used 6 months   Sexual activity: Yes    Birth control/protection: None  Other Topics Concern   Not on  file  Social History Narrative   Not on file   Social Determinants of Health   Financial Resource Strain: Not on file  Food Insecurity: Not on file  Transportation Needs: Not on file  Physical Activity: Not on file  Stress: Not on file  Social Connections: Not on file     Family History: The patient's family history includes Alcoholism in his father and mother.  ROS:   Please see the history of present illness.     All other systems reviewed and are negative.  EKGs/Labs/Other Studies Reviewed:    The following studies were reviewed today:   EKG:  EKG is  ordered today.  The ekg ordered today demonstrates   03/12/2023- sinus bradycardia HR 55 bpm  Recent Labs: 11/23/2022: ALT 22; BUN 10; Creatinine, Ser 0.71; Hemoglobin 12.3; Magnesium 2.0; Platelets 172; Potassium 4.3; Sodium 137; TSH 0.828   Recent Lipid Panel    Component Value Date/Time   CHOL 161 04/12/2015 1009   TRIG 97 04/12/2015 1009   HDL 79 04/12/2015 1009   CHOLHDL 2.0 04/12/2015 1009   VLDL 19 04/12/2015 1009   LDLCALC 63 04/12/2015 1009     Risk Assessment/Calculations:     Physical Exam:    VS:  Vitals:   03/12/23 1440  BP: 135/83  Pulse: (!) 56  SpO2: 95%     Wt Readings from Last 3 Encounters:  03/12/23 150 lb 9.6 oz (68.3 kg)  11/23/22 180 lb (81.6 kg)  02/03/17 160 lb (72.6 kg)     GEN:  Well nourished, well developed in no acute distress HEENT: Normal CARDIAC: RRR, no murmurs, rubs, gallops RESPIRATORY:  Clear to auscultation without rales, wheezing or rhonchi  ABDOMEN: Soft, non-tender, non-distended MUSCULOSKELETAL:  No edema; No deformity  SKIN: Warm and dry NEUROLOGIC:  Alert and oriented x 3 PSYCHIATRIC:  Normal affect   ASSESSMENT:   ?TIA:  - continue asa 81 mg daily and lipitor 40 mg daily -will r/o cardioembolic source   PLAN:    In order of problems listed above:  30 day preventice TTE with bubble study Follow up pending results             Medication Adjustments/Labs and Tests Ordered: Current medicines are reviewed at length with the patient today.  Concerns regarding medicines are outlined above.  Orders Placed This Encounter  Procedures   CARDIAC EVENT MONITOR  EKG 12-Lead   ECHOCARDIOGRAM COMPLETE BUBBLE STUDY   No orders of the defined types were placed in this encounter.   Patient Instructions  Medication Instructions:  Your physician recommends that you continue on your current medications as directed. Please refer to the Current Medication list given to you today.   *If you need a refill on your cardiac medications before your next appointment, please call your pharmacy*   Lab Work:  If you have labs (blood work) drawn today and your tests are completely normal, you will receive your results only by: MyChart Message (if you have MyChart) OR A paper copy in the mail If you have any lab test that is abnormal or we need to change your treatment, we will call you to review the results.   Testing/Procedures: Your physician has requested that you have an echocardiogram. Echocardiography is a painless test that uses sound waves to create images of your heart. It provides your doctor with information about the size and shape of your heart and how well your heart's chambers and valves are working. This procedure takes approximately one hour. There are no restrictions for this procedure. Please do NOT wear cologne,aftershave, or lotions (deodorant is allowed). Please arrive 15 minutes prior to your appointment time.  [employer] Cardiac Event Monitor Instructions  Your physician has requested you wear your cardiac event monitor for __30___ days, (1-30). [employer] may call or text to confirm a shipping address. The monitor will be sent to a land address via UPS. [employer] will not ship a monitor to a PO BOX. It typically takes 3-5 days to receive your monitor after it has been enrolled. [employer] will assist  with USPS tracking if your package is delayed. The telephone number for [employer] is [phone]. Once you have received your monitor, please review the enclosed instructions. Instruction tutorials can also be viewed under help and settings on the enclosed cell phone. Your monitor has already been registered assigning a specific monitor serial # to you.  Billing and Self Pay Discount Information  [employer] has been provided the insurance information we had on file for you.  If your insurance has been updated, please call [employer] at [phone] to provide them with your updated insurance information.   [employer] offers a discounted Self Pay option for patients who have insurance that does not cover their cardiac event monitor or patients without insurance.  The discounted cost of a Self Pay Cardiac Event Monitor would be $225.00 , if the patient contacts [employer] at [phone] within 7 days of applying the monitor to make payment arrangements.  If the patient does not contact [employer] within 7 days of applying the monitor, the cost of the cardiac event monitor will be $350.00.  Applying the monitor  Remove cell phone from case and turn it on. The cell phone works as IT consultant and needs to be within [employer] of you at all times. The cell phone will need to be charged on a daily basis. We recommend you plug the cell phone into the enclosed charger at your bedside table every night.  Monitor batteries: You will receive two monitor batteries labelled #1 and #2. These are your recorders. Plug battery #2 onto the second connection on the enclosed charger. Keep one battery on the charger at all times. This will keep the monitor battery deactivated. It will also keep it fully charged for when you need to switch your monitor batteries. A small light will be blinking on the battery emblem when  it is charging. The light on the battery emblem will remain on when the battery is fully  charged.  Open package of a Monitor strip. Insert battery #1 into black hood on strip and gently squeeze monitor battery onto connection as indicated in instruction booklet. Set aside while preparing skin.  Choose location for your strip, vertical or horizontal, as indicated in the instruction booklet. Shave to remove all hair from location. There cannot be any lotions, oils, powders, or colognes on skin where monitor is to be applied. Wipe skin clean with enclosed Saline wipe. Dry skin completely.  Peel paper labeled #1 off the back of the Monitor strip exposing the adhesive. Place the monitor on the chest in the vertical or horizontal position shown in the instruction booklet. One arrow on the monitor strip must be pointing upward. Carefully remove paper labeled #2, attaching remainder of strip to your skin. Try not to create any folds or wrinkles in the strip as you apply it.  Firmly press and release the circle in the center of the monitor battery. You will hear a small beep. This is turning the monitor battery on. The heart emblem on the monitor battery will light up every 5 seconds if the monitor battery in turned on and connected to the patient securely. Do not push and hold the circle down as this turns the monitor battery off. The cell phone will locate the monitor battery. A screen will appear on the cell phone checking the connection of your monitor strip. This may read poor connection initially but change to good connection within the next minute. Once your monitor accepts the connection you will hear a series of 3 beeps followed by a climbing crescendo of beeps. A screen will appear on the cell phone showing the two monitor strip placement options. Touch the picture that demonstrates where you applied the monitor strip.  Your monitor strip and battery are waterproof. You are able to shower, bathe, or swim with the monitor on. They just ask you do not submerge deeper than 3 feet  underwater. We recommend removing the monitor if you are swimming in a lake, river, or ocean.  Your monitor battery will need to be switched to a fully charged monitor battery approximately once a week. The cell phone will alert you of an action which needs to be made.  On the cell phone, tap for details to reveal connection status, monitor battery status, and cell phone battery status. The green dots indicates your monitor is in good status. A red dot indicates there is something that needs your attention.  To record a symptom, click the circle on the monitor battery. In 30-60 seconds a list of symptoms will appear on the cell phone. Select your symptom and tap save. Your monitor will record a sustained or significant arrhythmia regardless of you clicking the button. Some patients do not feel the heart rhythm irregularities. [employer] will notify us of any serious or critical events.  Refer to instruction booklet for instructions on switching batteries, changing strips, the Do not disturb or Pause features, or any additional questions.  Call [employer] at [phone], to confirm your monitor is transmitting and record your baseline. They will answer any questions you may have regarding the monitor instructions at that time.  Returning the monitor to [employer]  Place all equipment back into blue box. Peel off strip of paper to expose adhesive and close box securely. There is a prepaid UPS shipping label on this box. Drop in  a UPS drop box, or at a UPS facility like [employer]. You may also contact [employer] to arrange UPS to pick up monitor package at your home.    Follow-Up: At [facility], you and your health needs are our priority.  As part of our continuing mission to provide you with exceptional heart care, we have created designated Provider Care Teams.  These Care Teams include your primary Cardiologist (physician) and Advanced Practice Providers (APPs -  Physician  Assistants and Nurse Practitioners) who all work together to provide you with the care you need, when you need it.  We recommend signing up for the patient portal called "MyChart".  Sign up information is provided on this After Visit Summary.  MyChart is used to connect with patients for Virtual Visits (Telemedicine).  Patients are able to view lab/test results, encounter notes, upcoming appointments, etc.  Non-urgent messages can be sent to your provider as well.   To learn more about what you can do with MyChart, go to [email or web address].    Your next appointment:   As needed   Provider:   Maisie Fus, MD      Signed, Maisie Fus, MD  03/12/2023 3:28 PM    [facility]

## 2023-03-20 DIAGNOSIS — G459 Transient cerebral ischemic attack, unspecified: Secondary | ICD-10-CM

## 2023-04-14 ENCOUNTER — Ambulatory Visit (HOSPITAL_COMMUNITY): Payer: Medicaid Other | Attending: Internal Medicine

## 2023-04-15 ENCOUNTER — Encounter (HOSPITAL_COMMUNITY): Payer: Self-pay | Admitting: Internal Medicine

## 2023-04-28 ENCOUNTER — Ambulatory Visit: Payer: Medicaid Other | Attending: Internal Medicine

## 2023-04-28 DIAGNOSIS — G459 Transient cerebral ischemic attack, unspecified: Secondary | ICD-10-CM

## 2023-06-01 ENCOUNTER — Emergency Department (HOSPITAL_COMMUNITY)
Admission: EM | Admit: 2023-06-01 | Discharge: 2023-06-01 | Disposition: A | Discharge status: Home / Self Care | Payer: Medicaid Other | Attending: Emergency Medicine | Admitting: Emergency Medicine

## 2023-06-01 ENCOUNTER — Other Ambulatory Visit: Payer: Self-pay

## 2023-06-01 ENCOUNTER — Emergency Department (HOSPITAL_COMMUNITY): Payer: Medicaid Other

## 2023-06-01 DIAGNOSIS — K0889 Other specified disorders of teeth and supporting structures: Secondary | ICD-10-CM | POA: Insufficient documentation

## 2023-06-01 DIAGNOSIS — Z7982 Long term (current) use of aspirin: Secondary | ICD-10-CM | POA: Insufficient documentation

## 2023-06-01 DIAGNOSIS — T401X1A Poisoning by heroin, accidental (unintentional), initial encounter: Secondary | ICD-10-CM | POA: Diagnosis not present

## 2023-06-01 LAB — COMPREHENSIVE METABOLIC PANEL
ALT: 16 U/L (ref 0–44)
AST: 20 U/L (ref 15–41)
Albumin: 4.2 g/dL (ref 3.5–5.0)
Alkaline Phosphatase: 74 U/L (ref 38–126)
Anion gap: 10 (ref 5–15)
BUN: 11 mg/dL (ref 6–20)
CO2: 26 mmol/L (ref 22–32)
Calcium: 9.1 mg/dL (ref 8.9–10.3)
Chloride: 97 mmol/L — ABNORMAL LOW (ref 98–111)
Creatinine, Ser: 0.89 mg/dL (ref 0.61–1.24)
GFR, Estimated: >60 mL/min (ref >60)
Glucose, Bld: 114 mg/dL — ABNORMAL HIGH (ref 70–99)
Potassium: 3.8 mmol/L (ref 3.5–5.1)
Sodium: 133 mmol/L — ABNORMAL LOW (ref 135–145)
Total Bilirubin: 0.4 mg/dL (ref 0.3–1.2)
Total Protein: 7.6 g/dL (ref 6.5–8.1)

## 2023-06-01 LAB — CBC WITH DIFFERENTIAL/PLATELET
Abs Immature Granulocytes: 0.01 10*3/uL (ref 0.00–0.07)
Basophils Absolute: 0.1 10*3/uL (ref 0.0–0.1)
Basophils Relative: 1 %
Eosinophils Absolute: 0.2 10*3/uL (ref 0.0–0.5)
Eosinophils Relative: 2 %
HCT: 38 % — ABNORMAL LOW (ref 39.0–52.0)
Hemoglobin: 12.1 g/dL — ABNORMAL LOW (ref 13.0–17.0)
Immature Granulocytes: 0 %
Lymphocytes Relative: 23 %
Lymphs Abs: 1.9 10*3/uL (ref 0.7–4.0)
MCH: 29.6 pg (ref 26.0–34.0)
MCHC: 31.8 g/dL (ref 30.0–36.0)
MCV: 92.9 fL (ref 80.0–100.0)
Monocytes Absolute: 0.6 10*3/uL (ref 0.1–1.0)
Monocytes Relative: 7 %
Neutro Abs: 5.5 10*3/uL (ref 1.7–7.7)
Neutrophils Relative %: 67 %
Platelets: 204 10*3/uL (ref 150–400)
RBC: 4.09 MIL/uL — ABNORMAL LOW (ref 4.22–5.81)
RDW: 13.3 % (ref 11.5–15.5)
WBC: 8.2 10*3/uL (ref 4.0–10.5)
nRBC: 0 % (ref 0.0–0.2)

## 2023-06-01 LAB — RAPID URINE DRUG SCREEN, HOSP PERFORMED
Amphetamines: NOT DETECTED
Barbiturates: NOT DETECTED
Benzodiazepines: NOT DETECTED
Cocaine: NOT DETECTED
Opiates: POSITIVE — AB
Tetrahydrocannabinol: NOT DETECTED

## 2023-06-01 LAB — SALICYLATE LEVEL: Salicylate Lvl: <7 mg/dL — ABNORMAL LOW (ref 7.0–30.0)

## 2023-06-01 LAB — ACETAMINOPHEN LEVEL: Acetaminophen (Tylenol), Serum: <10 ug/mL — ABNORMAL LOW (ref 10–30)

## 2023-06-01 LAB — ETHANOL: Alcohol, Ethyl (B): <10 mg/dL (ref <10)

## 2023-06-01 MED ORDER — NALOXONE HCL 0.4 MG/ML IJ SOLN
0.4000 mg | Freq: Once | INTRAMUSCULAR | Status: AC
  Administered 2023-06-01: 0.4 mg via INTRAVENOUS
  Filled 2023-06-01: qty 1

## 2023-06-01 NOTE — ED Provider Notes (Signed)
[place] EMERGENCY DEPARTMENT AT [facility] Provider Note   CSN: [id number] Arrival date & time: 06/01/23  0143     History  Chief Complaint  Patient presents with   Drug Overdose    [name] is a 62 y.o. male.  Patient brought in by EMS after overdose.  He was found by his girlfriend unresponsive.  He was given 0.5 mg of IV Narcan by EMS and is now awake and alert.  He does admit to snorting heroin prior to arrival.  He states he was taking this for his gout pain.  He has used heroin several times in the past but not regular basis.  Denies any thoughts of self-harm.  He feels back to baseline now.  Denies any headache, neck pain, back pain, chest pain or abdominal pain.  Complaining of pain to his feet from his gout. Denies any other ingestions.  Was going to have a beer but was able to get this before he passed out. No other drug or alcohol use.  No dizziness or lightheadedness.  No chest pain or shortness of breath.  The history is provided by the patient and the EMS personnel. The history is limited by the condition of the patient.  Drug Overdose Pertinent negatives include no chest pain, no abdominal pain, no headaches and no shortness of breath.       Home Medications Prior to Admission medications   Medication Sig Start Date End Date Taking? Authorizing Provider  acetaminophen (TYLENOL) 500 MG tablet Take 1 tablet (500 mg total) by mouth every 6 (six) hours as needed. 10/19/18   Fayrene Helper, PA-C  albuterol (PROVENTIL) (2.5 MG/3ML) 0.083% nebulizer solution Inhale 3 mL 3 times a day by nebulization route for 90 days, for shortness of breath and wheezing. 02/02/23   [provider]  albuterol (VENTOLIN HFA) 108 (90 Base) MCG/ACT inhaler 2 puffs q 4 hours for shortness of breath 10/28/22   [provider]  allopurinol (ZYLOPRIM) 100 MG tablet Take 1 tablet by mouth daily. 01/08/21   [provider]  ARIPiprazole (ABILIFY) 5 MG  tablet Take 5 mg by mouth once. 01/08/21   [provider]  Aspirin 81 MG CAPS Take 1 capsule every day by oral route for 90 days. 10/28/22   [provider]  atorvastatin (LIPITOR) 40 MG tablet Take 1 tablet by mouth at bedtime. 10/29/15   [provider]  cloNIDine (CATAPRES) 0.1 MG tablet Take 0.1 mg by mouth daily. 10/28/15   [provider]  colchicine 0.6 MG tablet Take 1 tablet by mouth daily. 02/01/23   [provider]  famotidine (PEPCID) 20 MG tablet Take 20 mg by mouth daily. 10/20/21   [provider]  fluticasone (FLONASE) 50 MCG/ACT nasal spray Place 2 sprays into both nostrils daily. 11/04/15   [provider]  HYDROcodone-acetaminophen (NORCO/VICODIN) 5-325 MG tablet Take 1-2 tablets by mouth. 04/18/16   [provider]  lisinopril-hydrochlorothiazide (ZESTORETIC) 20-25 MG tablet Take 1 tablet by mouth daily. 08/06/21   [provider]  sildenafil (VIAGRA) 100 MG tablet Take 1 tablet by mouth daily. 01/15/20   [provider]  traMADol (ULTRAM) 50 MG tablet Take 1 tablet by mouth every 12 (twelve) hours as needed. 09/16/22   [provider]      Allergies    Borax    Review of Systems   Review of Systems  Constitutional:  Negative for activity change, appetite change and fever.  HENT:  Negative for congestion and rhinorrhea.   Respiratory:  Negative for cough, chest tightness and shortness of breath.   Cardiovascular:  Negative for chest pain.  Gastrointestinal:  Negative for abdominal pain, nausea and vomiting.  Genitourinary:  Negative for dysuria.  Musculoskeletal:  Positive for arthralgias and myalgias.  Skin:  Negative for rash.  Neurological:  Negative for dizziness, weakness and headaches.   all other systems are negative except as noted in the HPI and PMH.    Physical Exam Updated Vital Signs BP (!) 141/65 (BP Location: Right Arm)   Pulse 68   Temp 98.8 F (37.1 C) (Oral)    Resp 15   Wt 72.6 kg   SpO2 100%   BMI 26.63 kg/m  Physical Exam Vitals and nursing note reviewed.  Constitutional:      General: He is not in acute distress.    Appearance: He is well-developed.  HENT:     Head: Normocephalic and atraumatic.     Mouth/Throat:     Pharynx: No oropharyngeal exudate.     Comments: Poor dentition Eyes:     Conjunctiva/sclera: Conjunctivae normal.     Pupils: Pupils are equal, round, and reactive to light.  Neck:     Comments: No meningismus. Cardiovascular:     Rate and Rhythm: Normal rate and regular rhythm.     Heart sounds: Normal heart sounds. No murmur heard. Pulmonary:     Effort: Pulmonary effort is normal. No respiratory distress.     Breath sounds: Normal breath sounds.  Abdominal:     Palpations: Abdomen is soft.     Tenderness: There is no abdominal tenderness. There is no guarding or rebound.  Musculoskeletal:        General: No tenderness. Normal range of motion.     Cervical back: Normal range of motion and neck supple.  Skin:    General: Skin is warm.  Neurological:     Mental Status: He is alert and oriented to person, place, and time.     Cranial Nerves: No cranial nerve deficit.     Motor: No abnormal muscle tone.     Coordination: Coordination normal.     Comments:  5/5 strength throughout. CN 2-12 intact.Equal grip strength.   Psychiatric:        Behavior: Behavior normal.     ED Results / Procedures / Treatments   Labs (all labs ordered are listed, but only abnormal results are displayed) Labs Reviewed  CBC WITH DIFFERENTIAL/PLATELET - Abnormal; Notable for the following components:      Result Value   RBC 4.09 (*)    Hemoglobin 12.1 (*)    HCT 38.0 (*)    All other components within normal limits  COMPREHENSIVE METABOLIC PANEL - Abnormal; Notable for the following components:   Sodium 133 (*)    Chloride 97 (*)    Glucose, Bld 114 (*)    All other components within normal limits  ACETAMINOPHEN LEVEL -  Abnormal; Notable for the following components:   Acetaminophen (Tylenol), Serum <10 (*)    All other components within normal limits  SALICYLATE LEVEL - Abnormal; Notable for the following components:   Salicylate Lvl <7.0 (*)    All other components within normal limits  RAPID URINE DRUG SCREEN, HOSP PERFORMED - Abnormal; Notable for the following components:   Opiates POSITIVE (*)    All other components within normal limits  ETHANOL    EKG None  Radiology DG Chest Portable 1 View  Result Date: 06/01/2023 CLINICAL DATA:  Chest pain, overdose. EXAM: PORTABLE CHEST 1 VIEW COMPARISON:  10/19/2018. FINDINGS: Heart is enlarged and the mediastinal contour is within normal limits. There is atherosclerotic calcification of the aorta. No consolidation, effusion, or pneumothorax. No acute osseous abnormality. IMPRESSION: No active disease. Electronically Signed   By: Thornell Sartorius M.D.   On: 06/01/2023 03:14    Procedures .Critical Care  Performed by: Glynn Octave, MD Authorized by: Glynn Octave, MD   Critical care provider statement:    Critical care time (minutes):  35   Critical care time was exclusive of:  Separately billable procedures and treating other patients   Critical care was necessary to treat or prevent imminent or life-threatening deterioration of the following conditions:  Toxidrome   Critical care was time spent personally by me on the following activities:  Development of treatment plan with patient or surrogate, discussions with consultants, evaluation of patient's response to treatment, examination of patient, ordering and review of laboratory studies, ordering and review of radiographic studies, ordering and performing treatments and interventions, pulse oximetry, re-evaluation of patient's condition, review of old charts, blood draw for specimens and obtaining history from patient or surrogate   I assumed direction of critical care for this patient from another  provider in my specialty: no       Medications Ordered in ED Medications - No data to display  ED Course/ Medical Decision Making/ A&P                                 Medical Decision Making Amount and/or Complexity of Data Reviewed Labs: ordered. Decision-making details documented in ED Course. Radiology: ordered and independent interpretation performed. Decision-making details documented in ED Course. ECG/medicine tests: ordered and independent interpretation performed. Decision-making details documented in ED Course.  Risk Prescription drug management.   Presumed accidental overdose of heroin, now awake and alert after Narcan.  He is in no respiratory distress.  Lungs are clear.  Patient did require additional Narcan upon arrival to the ED for bradypnea.  He is given IV fluids.  Labs are reassuring.  Alcohol level is undetectable.  Drug screen is positive for opiates.  Patient observed in the ED for 4 hours without requirement of additional Narcan.  He is tolerating p.o. and ambulatory.  He denies any suicidal intent.  Discussed cessation of using heroin.  Follow-up with PCP.  Return precautions discussed.        Final Clinical Impression(s) / ED Diagnoses Final diagnoses:  Accidental overdose of heroin, initial encounter [facility])    Rx / DC Orders ED Discharge Orders     None         Roderick Calo, Jeannett Senior, MD 06/01/23 [phone]

## 2023-06-01 NOTE — Discharge Instructions (Signed)
Stop using heroin. Take your medications as prescribed. Return to the ED with new or worsening symptoms.

## 2023-06-01 NOTE — ED Triage Notes (Signed)
From home , pt used heroin , found by GF , states pt wasn't acting like himself, became unresponsive, EMS gave 0.5 narcan IV , pt responded, able to answer questions , slightly lethargic, denies OD being intentional, reports normal use

## 2023-07-02 ENCOUNTER — Encounter (HOSPITAL_COMMUNITY): Payer: Self-pay

## 2023-07-02 ENCOUNTER — Emergency Department (HOSPITAL_COMMUNITY)
Admission: EM | Admit: 2023-07-02 | Discharge: 2023-07-02 | Disposition: A | Discharge status: Home / Self Care | Payer: Medicaid Other | Attending: Emergency Medicine | Admitting: Emergency Medicine

## 2023-07-02 ENCOUNTER — Other Ambulatory Visit: Payer: Self-pay

## 2023-07-02 DIAGNOSIS — Z7982 Long term (current) use of aspirin: Secondary | ICD-10-CM | POA: Diagnosis not present

## 2023-07-02 DIAGNOSIS — N486 Induration penis plastica: Secondary | ICD-10-CM | POA: Insufficient documentation

## 2023-07-02 DIAGNOSIS — N4889 Other specified disorders of penis: Secondary | ICD-10-CM | POA: Diagnosis present

## 2023-07-02 MED ORDER — IBUPROFEN 600 MG PO TABS: 600.0000 mg | ORAL_TABLET | Freq: Three times a day (TID) | ORAL | 0 refills | Status: AC | PRN

## 2023-07-02 NOTE — Discharge Instructions (Addendum)
Please read the instructions provided on what appears to be Peyronie's disease.  Call the urologist at the number provided to set up an appointment.  As discussed, you will likely have discomfort with erection.  If there is significant discomfort, take ibuprofen.  Come to the ER if there is prolonged erection.  Avoid medications such as Viagra.

## 2023-07-02 NOTE — ED Provider Notes (Signed)
[place] EMERGENCY DEPARTMENT AT [facility] Provider Note   CSN: [id number] Arrival date & time: 07/02/23  1327     History  Chief Complaint  Patient presents with   Penis Pain    [name] is a 62 y.o. male.  HPI    62 year old male comes in with chief complaint of penile pain.  Patient indicates that he normally has a right-sided band on his penis, but yesterday when he took the Viagra, the penis bent forward, and the band was more acute.  The erection lasted for about 2 hours, it was painful.  He was unable to have intercourse because of pain.  He did not have any trauma during the intercourse, and the pain and the band were actually present before he started having sex.  Currently patient does not have erection and is pain-free.  However whenever he has erection, there is a visible different band and he is having pain.  He went to his PCP, called his PCP and they advised that he come to the ER. He has voided since, without any issues.  Home Medications Prior to Admission medications   Medication Sig Start Date End Date Taking? Authorizing Provider  ibuprofen (ADVIL) 600 MG tablet Take 1 tablet (600 mg total) by mouth every 8 (eight) hours as needed. 07/02/23  Yes Derwood Kaplan, MD  acetaminophen (TYLENOL) 500 MG tablet Take 1 tablet (500 mg total) by mouth every 6 (six) hours as needed. 10/19/18   Fayrene Helper, PA-C  albuterol (PROVENTIL) (2.5 MG/3ML) 0.083% nebulizer solution Inhale 3 mL 3 times a day by nebulization route for 90 days, for shortness of breath and wheezing. 02/02/23   [provider]  albuterol (VENTOLIN HFA) 108 (90 Base) MCG/ACT inhaler 2 puffs q 4 hours for shortness of breath 10/28/22   [provider]  allopurinol (ZYLOPRIM) 100 MG tablet Take 1 tablet by mouth daily. 01/08/21   [provider]  ARIPiprazole (ABILIFY) 5 MG tablet Take 5 mg by mouth once. 01/08/21   [provider]  Aspirin 81 MG CAPS Take  1 capsule every day by oral route for 90 days. 10/28/22   [provider]  ASPIRIN LOW DOSE 81 MG tablet Take 81 mg by mouth daily. 04/21/23   [provider]  atorvastatin (LIPITOR) 40 MG tablet Take 1 tablet by mouth at bedtime. 10/29/15   [provider]  cloNIDine (CATAPRES) 0.1 MG tablet Take 0.1 mg by mouth daily. 10/28/15   [provider]  colchicine 0.6 MG tablet Take 1 tablet by mouth daily. 02/01/23   [provider]  famotidine (PEPCID) 20 MG tablet Take 20 mg by mouth daily. 10/20/21   [provider]  fluticasone (FLONASE) 50 MCG/ACT nasal spray Place 2 sprays into both nostrils daily. 11/04/15   [provider]  HYDROcodone-acetaminophen (NORCO/VICODIN) 5-325 MG tablet Take 1-2 tablets by mouth. 04/18/16   [provider]  lisinopril-hydrochlorothiazide (ZESTORETIC) 20-25 MG tablet Take 1 tablet by mouth daily. 08/06/21   [provider]  loratadine (CLARITIN) 10 MG tablet Take 10 mg by mouth daily. 04/21/23   [provider]  omeprazole (PRILOSEC) 40 MG capsule Take 40 mg by mouth daily. 04/21/23   [provider]  sildenafil (VIAGRA) 100 MG tablet Take 1 tablet by mouth daily. 01/15/20   [provider]  SYMBICORT 80-4.5 MCG/ACT inhaler Inhale 2 puffs into the lungs 2 (two) times daily. 04/21/23   [provider]  traMADol Janean Sark)  50 MG tablet Take 1 tablet by mouth every 12 (twelve) hours as needed. 09/16/22   [provider]      Allergies    Borax    Review of Systems   Review of Systems  All other systems reviewed and are negative.   Physical Exam Updated Vital Signs BP (!) 146/75 (BP Location: Left Arm)   Pulse 67   Temp 97.9 F (36.6 C) (Oral)   Resp 14   Ht 5\' 5"  (1.651 m)   Wt 63.5 kg   SpO2 98%   BMI 23.30 kg/m  Physical Exam Vitals and nursing note reviewed. Exam conducted with a chaperone present.  Constitutional:      Appearance: He is  well-developed.  HENT:     Head: Atraumatic.  Cardiovascular:     Rate and Rhythm: Normal rate.  Pulmonary:     Effort: Pulmonary effort is normal.  Genitourinary:    Comments: Male chaperone was present during the evaluation.  On exam, patient has no priapism/erection.  No gross deformity noted Musculoskeletal:     Cervical back: Neck supple.  Skin:    General: Skin is warm.  Neurological:     Mental Status: He is alert and oriented to person, place, and time.     ED Results / Procedures / Treatments   Labs (all labs ordered are listed, but only abnormal results are displayed) Labs Reviewed - No data to display  EKG None  Radiology No results found.  Procedures Procedures    Medications Ordered in ED Medications - No data to display  ED Course/ Medical Decision Making/ A&P                                 Medical Decision Making Risk Prescription drug management.   62 year old patient comes in with chief complaint of painful erection. Currently patient has a flaccid penis.  Differential diagnosis considered for this patient includes Peyronie's disease.  We also considered priapism, patient is not erect and currently pain-free, therefore it is unlikely for him to be having priapism.  Did consult urologist and spoke with Dr. Anastasia Pall agrees that there is no need for emergent evaluation.  Outpatient follow-up recommended with NSAIDs as needed.  He indicated that patient does not need to change anything from lifestyle perspective, however I indicated to the patient that most likely will be best to not take any sildenafil until seen by urologist.  Final Clinical Impression(s) / ED Diagnoses Final diagnoses:  Peyronie disease    Rx / DC Orders ED Discharge Orders          Ordered    ibuprofen (ADVIL) 600 MG tablet  Every 8 hours PRN        07/02/23 1449              Derwood Kaplan, MD 07/02/23 1505

## 2023-07-02 NOTE — ED Triage Notes (Addendum)
Pt took sildenafil a few days ago and now states he has a curve in his penis that is painful. Pt has been dealing with this a few days. Pt is still able to urinate and has no issues. No issues with sensation

## 2023-09-09 ENCOUNTER — Encounter: Payer: Self-pay | Admitting: Physician Assistant

## 2023-09-09 NOTE — Progress Notes (Addendum)
Pt has hx HTN, Sz, says he has been compliant with meds  Pt still smokes.  C/o constant runny nose since nasal tumor was removed.   From office note 02/26/2016: left nasal mass. Over the past year, he has had a growing mass in the left side of the nose that has become fully blocked and there is significant pain. It bleeds frequently. It hurts into his upper teeth and the eye. 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  No enlarged lymph nodes.   He needs f/u with ENT, hopefully can get w/ Dr Christia Reading.   Has very poor dentition w/ mult teeth missing. He has a dentist.  [facility] is his PCP [address] [place] [place], [place] [address] Phone: [phone]  Christopher Demark, PA-C 09/09/2023 9:42 AM   Patient presents with Rhinitis, chronic. He report having symptoms since tumor removed 2017. Plan to treat with flonase, Claritin for 7 days. He will be refer to ENT>

## 2023-09-30 ENCOUNTER — Other Ambulatory Visit: Payer: Self-pay | Admitting: Critical Care Medicine

## 2023-09-30 ENCOUNTER — Encounter: Payer: Self-pay | Admitting: Critical Care Medicine

## 2023-09-30 DIAGNOSIS — J329 Chronic sinusitis, unspecified: Secondary | ICD-10-CM

## 2023-09-30 DIAGNOSIS — D140 Benign neoplasm of middle ear, nasal cavity and accessory sinuses: Secondary | ICD-10-CM

## 2023-09-30 MED ORDER — AMOXICILLIN-POT CLAVULANATE 875-125 MG PO TABS: 1.0000 | ORAL_TABLET | Freq: Two times a day (BID) | ORAL | 0 refills | Status: AC

## 2023-09-30 NOTE — Progress Notes (Signed)
Rx sinusitis ENT referral

## 2023-09-30 NOTE — Progress Notes (Signed)
sinusitis

## 2023-10-01 NOTE — Progress Notes (Signed)
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  On exam there is purulence in the left nare and dryness of the mucosa  Plan is to give patient nasal saline and nasal gel along with Flonase and will receive a 7-day course of Augmentin twice daily  Referral back to ENT was made
